# Patient Record
Sex: Female | Born: 2000
Health system: Southern US, Community
[De-identification: ages and names within clinical notes are randomized; demographics above are authoritative.]

## PROBLEM LIST (undated history)

## (undated) DIAGNOSIS — A64 Unspecified sexually transmitted disease: Secondary | ICD-10-CM

## (undated) DIAGNOSIS — F988 Other specified behavioral and emotional disorders with onset usually occurring in childhood and adolescence: Secondary | ICD-10-CM

## (undated) HISTORY — DX: Unspecified sexually transmitted disease: A64

## (undated) HISTORY — DX: Other specified behavioral and emotional disorders with onset usually occurring in childhood and adolescence: F98.8

---
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
## 2018-01-01 ENCOUNTER — Ambulatory Visit (INDEPENDENT_AMBULATORY_CARE_PROVIDER_SITE_OTHER): Payer: 59

## 2018-01-01 DIAGNOSIS — N926 Irregular menstruation, unspecified: Secondary | ICD-10-CM | POA: Diagnosis not present

## 2018-01-01 DIAGNOSIS — Z308 Encounter for other contraceptive management: Secondary | ICD-10-CM | POA: Diagnosis not present

## 2018-01-01 LAB — POCT URINE PREGNANCY: PREG TEST UR: NEGATIVE

## 2018-01-01 MED ORDER — MEDROXYPROGESTERONE ACETATE 150 MG/ML IM SUSP
150.0000 mg | Freq: Once | INTRAMUSCULAR | Status: AC
  Administered 2018-01-01: 150 mg via INTRAMUSCULAR

## 2018-01-01 NOTE — Progress Notes (Signed)
Per orders of Dr. Fabian SharpPanosh, injection of Medroxyprogesterone 150 mg/ml given by Carola RhineNancy N Nainika Newlun. Patient tolerated injection well. Pt had a pregnancy urine test due to being late for her contraceptive injection.
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
## 2018-01-05 ENCOUNTER — Ambulatory Visit: Payer: 59 | Admitting: Internal Medicine

## 2018-01-05 NOTE — Progress Notes (Signed)
Chief Complaint  Patient presents with  . Urinary Tract Infection    urine collected, strong odor, irregular periods,     HPI: Janice Sutton 17 y.o. sda    Onset less than a week ago  And stopped.  And had awekk,  When at depo   Bad odor    ' 1 partner for  2 years  No vag dc  Worried about other problems  No fever flank pain    .  ROS: See pertinent positives and negatives per HPI.  Past Medical History:  Diagnosis Date  . ADD (attention deficit disorder)   . STD (sexually transmitted disease) 07/2016   Chlamydia     Family History  Problem Relation Age of Onset  . Asthma Mother     Social History   Socioeconomic History  . Marital status: Single    Spouse name: Not on file  . Number of children: Not on file  . Years of education: Not on file  . Highest education level: Not on file  Occupational History  . Not on file  Social Needs  . Financial resource strain: Not on file  . Food insecurity:    Worry: Not on file    Inability: Not on file  . Transportation needs:    Medical: Not on file    Non-medical: Not on file  Tobacco Use  . Smoking status: Light Tobacco Smoker  . Smokeless tobacco: Never Used  Substance and Sexual Activity  . Alcohol use: No  . Drug use: No  . Sexual activity: Yes    Partners: Male    Birth control/protection: Injection  Lifestyle  . Physical activity:    Days per week: Not on file    Minutes per session: Not on file  . Stress: Not on file  Relationships  . Social connections:    Talks on phone: Not on file    Gets together: Not on file    Attends religious service: Not on file    Active member of club or organization: Not on file    Attends meetings of clubs or organizations: Not on file    Relationship status: Not on file  Other Topics Concern  . Not on file  Social History Narrative   2 people living in the home. Mother and teen    pet yorkipoo   Moved from [place]   Father high school incarcerated   mother  Araiya Tilmon ;history of asthma; MBA accounting and finance   Negative ETS /FA    Outpatient Medications Prior to Visit  Medication Sig Dispense Refill  . betamethasone valerate ointment (VALISONE) 0.1 % Apply a pea sized amount topically BID x 1-2 weeks as needed 15 g 0  . cyclobenzaprine (FLEXERIL) 10 MG tablet Take 1 tablet (10 mg total) by mouth 2 (two) times daily as needed for muscle spasms. 10 tablet 0   No facility-administered medications prior to visit.      EXAM:  BP 124/76 (BP Location: Left Arm, Patient Position: Sitting, Cuff Size: Normal)   Pulse 76   Temp 98.5 F (36.9 C) (Oral)   Wt 124 lb 12.8 oz (56.6 kg)   There is no height or weight on file to calculate BMI.  GENERAL: vitals reviewed and listed above, alert, oriented, appears well hydrated and in no acute distress HEENT: atraumatic, conjunctiva  clear, no obvious abnormalities on inspection of external nose and ears  NECK: no obvious masses on inspection palpation  Abdomen:  Sof,t normal bowel sounds without hepatosplenomegaly, no guarding rebound or masses no CVA tenderness CV: HRRR, no clubbing cyanosis or  peripheral edema nl cap refill  MS: moves all extremities without noticeable focal  abnormality PSYCH: pleasant and cooperative, no obvious depression or anxiety poct ua   sent for cx and gc chlamydia screen  ASSESSMENT AND PLAN:  Discussed the following assessment and plan:  Dysuria - Plan: POC Urinalysis Dipstick, Urine Culture  Vaginal odor - Plan: Urine Culture, Urine cytology ancillary only  Depo-Provera contraceptive status  -Patient advised to return or notify health care team  if symptoms worsen ,persist or new concerns arise.  Patient Instructions  Urine looks clear today   Will let  You know when culture tests and  gc chlamydia screen is back   No treat ment if all negative and you are doing ok .     Neta MendsWanda K. Zuly Belkin M.D.

## 2018-01-06 ENCOUNTER — Ambulatory Visit (INDEPENDENT_AMBULATORY_CARE_PROVIDER_SITE_OTHER): Payer: 59 | Admitting: Internal Medicine

## 2018-01-06 ENCOUNTER — Encounter: Payer: Self-pay | Admitting: Internal Medicine

## 2018-01-06 VITALS — BP 124/76 | HR 76 | Temp 98.5°F | Wt 124.8 lb

## 2018-01-06 DIAGNOSIS — N898 Other specified noninflammatory disorders of vagina: Secondary | ICD-10-CM | POA: Diagnosis not present

## 2018-01-06 DIAGNOSIS — R300 Dysuria: Secondary | ICD-10-CM

## 2018-01-06 DIAGNOSIS — Z3042 Encounter for surveillance of injectable contraceptive: Secondary | ICD-10-CM | POA: Diagnosis not present

## 2018-01-06 LAB — POCT URINALYSIS DIPSTICK
Bilirubin, UA: NEGATIVE
Glucose, UA: NEGATIVE
Ketones, UA: NEGATIVE
LEUKOCYTES UA: NEGATIVE
NITRITE UA: NEGATIVE
Odor: NEGATIVE
PH UA: 8 (ref 5.0–8.0)
PROTEIN UA: NEGATIVE
RBC UA: NEGATIVE
SPEC GRAV UA: 1.02 (ref 1.010–1.025)
Urobilinogen, UA: 0.2 E.U./dL

## 2018-01-06 NOTE — Patient Instructions (Signed)
Urine looks clear today   Will let  You know when culture tests and  gc chlamydia screen is back   No treat ment if all negative and you are doing ok .

## 2018-04-29 ENCOUNTER — Other Ambulatory Visit: Payer: Self-pay | Admitting: Family Medicine

## 2018-04-29 ENCOUNTER — Telehealth: Payer: Self-pay | Admitting: Family Medicine

## 2018-04-29 ENCOUNTER — Ambulatory Visit (INDEPENDENT_AMBULATORY_CARE_PROVIDER_SITE_OTHER): Payer: 59 | Admitting: Family Medicine

## 2018-04-29 DIAGNOSIS — Z309 Encounter for contraceptive management, unspecified: Secondary | ICD-10-CM | POA: Diagnosis not present

## 2018-04-29 LAB — POCT URINE PREGNANCY: Preg Test, Ur: NEGATIVE

## 2018-04-29 MED ORDER — MEDROXYPROGESTERONE ACETATE 150 MG/ML IM SUSP
150.0000 mg | Freq: Once | INTRAMUSCULAR | Status: AC
  Administered 2018-04-29: 150 mg via INTRAMUSCULAR

## 2018-04-29 NOTE — Telephone Encounter (Signed)
I spoke with pt, she did complete UA preg-result negative, she declined the future orders that were in system urine culture/cytology, reported no symptoms. The last depo provera was given on 01/01/2018, last period cycle was several years ago-not sure of exact year. I gave depo provera injection today and the dates next one will be due.

## 2018-04-29 NOTE — Progress Notes (Signed)
Per orders of Dr. Fabian SharpPanosh, injection of Depo Provera 150 mg given by Aniceto BossNIMMONS, Avory Mimbs ANN, and completed a Urine pregnancy test results negative, pt was past due for the injection. Patient tolerated injection well.

## 2018-04-29 NOTE — Telephone Encounter (Signed)
Record date of last depo  And period and then  Urine preg tets  Before given depo

## 2018-04-29 NOTE — Telephone Encounter (Signed)
I spoke with pt and she will be here today, I put on the nurse schedule.

## 2018-04-29 NOTE — Telephone Encounter (Signed)
Copied from CRM [phone]. Topic: Appointment Scheduling - Scheduling Inquiry for Clinic >> Apr 28, 2018 11:49 AM Oneal GroutSebastian, Jennifer S wrote: Reason for CRM: Missed depo injection in June, requesting to schedule, ok to schedule? >> Apr 29, 2018 11:39 AM Odies Desa, Amie CritchleySylvia A, RN wrote: I spoke with pt, explained since there was a missed dose we would need to get a order from Dr. Fabian SharpPanosh for a urine pregnancy test first and then once results are back-if negative then we can give injection. I will forward this note to provider for review and then we will call pt back to schedule appointment.

## 2018-04-29 NOTE — Telephone Encounter (Signed)
Pt

## 2018-11-10 NOTE — Progress Notes (Signed)
Chief Complaint  Patient presents with  . Vaginal Discharge    with foul odor has been going on for 1 week. No symptoms of uti. No unprotected sex.    HPI: Janice Sutton 18 y.o. come in for sda   1 week of vaginal odor   No recent  sa  Uses condomes  Off  Since October  ahs ? About fertility future   No fever uti sx   abd pain  lmp a while since on dep off since october  ROS: See pertinent positives and negatives per HPI.  Past Medical History:  Diagnosis Date  . ADD (attention deficit disorder)   . STD (sexually transmitted disease) 07/2016   Chlamydia     Family History  Problem Relation Age of Onset  . Asthma Mother     Social History   Socioeconomic History  . Marital status: Single    Spouse name: Not on file  . Number of children: Not on file  . Years of education: Not on file  . Highest education level: Not on file  Occupational History  . Not on file  Social Needs  . Financial resource strain: Not on file  . Food insecurity:    Worry: Not on file    Inability: Not on file  . Transportation needs:    Medical: Not on file    Non-medical: Not on file  Tobacco Use  . Smoking status: Light Tobacco Smoker  . Smokeless tobacco: Never Used  Substance and Sexual Activity  . Alcohol use: No  . Drug use: No  . Sexual activity: Yes    Partners: Male    Birth control/protection: Injection  Lifestyle  . Physical activity:    Days per week: Not on file    Minutes per session: Not on file  . Stress: Not on file  Relationships  . Social connections:    Talks on phone: Not on file    Gets together: Not on file    Attends religious service: Not on file    Active member of club or organization: Not on file    Attends meetings of clubs or organizations: Not on file    Relationship status: Not on file  Other Topics Concern  . Not on file  Social History Narrative   2 people living in the home. Mother and teen    pet yorkipoo   [place]oved from [place]  [place]   Father high school incarcerated    mother  Brayton LaymanLatosha Winokur ;history of asthma; MBA accounting and finance   Negative ETS /FA    Outpatient Medications Prior to Visit  Medication Sig Dispense Refill  . betamethasone valerate ointment (VALISONE) 0.1 % Apply a pea sized amount topically BID x 1-2 weeks as needed 15 g 0  . cyclobenzaprine (FLEXERIL) 10 MG tablet Take 1 tablet (10 mg total) by mouth 2 (two) times daily as needed for muscle spasms. 10 tablet 0   No facility-administered medications prior to visit.      EXAM:  BP 110/66 (BP Location: Right Arm, Patient Position: Sitting, Cuff Size: Normal)   Pulse 75   Temp 98.1 F (36.7 C) (Oral)   Wt 129 lb 11.2 oz (58.8 kg)   There is no height or weight on file to calculate BMI.  GENERAL: vitals reviewed and listed above, alert, oriented, appears well hydrated and in no acute distress HEENT: atraumatic, conjunctiva  clear, no obvious abnormalities on inspection of external nose and ears  MS: moves all extremities without noticeable focal  Abnormality  ext gu nl  Mild white dc cx 1+ ectopy whit grey  Creamy dc. Fishy odor  PSYCH: pleasant and cooperative, no obvious depression or anxiety  BP Readings from Last 3 Encounters:  11/11/18 110/66  01/06/18 124/76  09/25/17 94/62    ASSESSMENT AND PLAN:  Discussed the following assessment and plan:  Vaginal odor - Plan: Cervicovaginal ancillary only, POCT urine pregnancy  Amenorrhea - Plan: POCT urine pregnancy  Routine screening for STI (sexually transmitted infection) Poss bv  sti screen today  Get poct preg test and if ok back on dep  Counseled.    consider empiric rx  For bv and or wait until results back unable to get ua today  -Patient advised to return or notify health care team  if  new concerns arise.  Patient Instructions  Checking for STI and  Vaginal infection.   Will notify you  of labs when available.   Ok to restart the  Depoprovera.    Depo.  Should not effect  Future fertility but short term fertility is decreased  In the short run  6-9 mos   .    Neta Mends. Norah Fick M.D.

## 2018-11-11 ENCOUNTER — Encounter: Payer: Self-pay | Admitting: Internal Medicine

## 2018-11-11 ENCOUNTER — Other Ambulatory Visit (HOSPITAL_COMMUNITY)
Admission: RE | Admit: 2018-11-11 | Discharge: 2018-11-11 | Disposition: A | Discharge status: Home / Self Care | Payer: 59 | Source: Ambulatory Visit | Attending: Internal Medicine | Admitting: Internal Medicine

## 2018-11-11 ENCOUNTER — Ambulatory Visit (INDEPENDENT_AMBULATORY_CARE_PROVIDER_SITE_OTHER): Payer: 59 | Admitting: Internal Medicine

## 2018-11-11 VITALS — BP 110/66 | HR 75 | Temp 98.1°F | Wt 129.7 lb

## 2018-11-11 DIAGNOSIS — N898 Other specified noninflammatory disorders of vagina: Secondary | ICD-10-CM

## 2018-11-11 DIAGNOSIS — N912 Amenorrhea, unspecified: Secondary | ICD-10-CM | POA: Diagnosis not present

## 2018-11-11 DIAGNOSIS — Z113 Encounter for screening for infections with a predominantly sexual mode of transmission: Secondary | ICD-10-CM

## 2018-11-11 NOTE — Patient Instructions (Addendum)
Checking for STI and  Vaginal infection.   Will notify you  of labs when available.   Ok to restart the  Depoprovera.    Depo. Should not effect  Future fertility but short term fertility is decreased  In the short run  6-9 mos   .

## 2018-11-12 LAB — CERVICOVAGINAL ANCILLARY ONLY
BACTERIAL VAGINITIS: POSITIVE — AB
CANDIDA VAGINITIS: NEGATIVE
Chlamydia: POSITIVE — AB
NEISSERIA GONORRHEA: NEGATIVE
TRICH (WINDOWPATH): NEGATIVE

## 2018-11-13 ENCOUNTER — Other Ambulatory Visit: Payer: Self-pay

## 2018-11-13 MED ORDER — AZITHROMYCIN 500 MG PO TABS: ORAL_TABLET | ORAL | 0 refills | Status: DC

## 2018-11-17 ENCOUNTER — Ambulatory Visit: Payer: 59

## 2018-11-19 ENCOUNTER — Ambulatory Visit (INDEPENDENT_AMBULATORY_CARE_PROVIDER_SITE_OTHER): Payer: 59 | Admitting: *Deleted

## 2018-11-19 DIAGNOSIS — Z308 Encounter for other contraceptive management: Secondary | ICD-10-CM | POA: Diagnosis not present

## 2018-11-19 LAB — POCT URINE PREGNANCY: PREG TEST UR: NEGATIVE

## 2018-11-19 MED ORDER — MEDROXYPROGESTERONE ACETATE 150 MG/ML IM SUSP
150.0000 mg | Freq: Once | INTRAMUSCULAR | Status: AC
  Administered 2018-11-19: 150 mg via INTRAMUSCULAR

## 2018-11-19 NOTE — Addendum Note (Signed)
Addended by: Maisie Fus on: 11/19/2018 04:25 PM   Modules accepted: Orders

## 2018-11-19 NOTE — Progress Notes (Addendum)
Per orders of Dr. Fabian Sharp, POC UPT completed, which was NEGATIVE. Injection of Depo Provera 150mg  given by Nadara Eaton M. Patient tolerated injection well.

## 2018-12-01 ENCOUNTER — Telehealth: Payer: Self-pay | Admitting: *Deleted

## 2018-12-01 NOTE — Telephone Encounter (Signed)
Copied from CRM [id number]. Topic: General - Inquiry >> Dec 01, 2018  9:59 AM Terisa Starr wrote: Reason for CRM: Patient states that Dr Fabian Sharp was going to test her urine again after she got the last results on 2/7. She would like to know if that had been done? Also, she would like to know how long it takes to be cured of the chlamydia?

## 2018-12-04 NOTE — Telephone Encounter (Signed)
Not sure what is being asked . Treat for chlamyudia   And partners treated and refrain from inttercourse for at least 7 days.   Then use condoms protection 100 %  .   Recheck is usually advise  In 4-6 months  Unless having on going symptoms .   She should make appt  With [place] .   For 4-6 months     Let me know if that answers  Her ?s

## 2018-12-07 NOTE — Telephone Encounter (Signed)
lvm for pt to call back.  

## 2018-12-07 NOTE — Telephone Encounter (Signed)
Okay for nurse triage to disclose.crm created

## 2019-01-18 ENCOUNTER — Encounter: Payer: Self-pay | Admitting: Internal Medicine

## 2019-01-18 ENCOUNTER — Other Ambulatory Visit: Payer: Self-pay

## 2019-01-18 ENCOUNTER — Ambulatory Visit (INDEPENDENT_AMBULATORY_CARE_PROVIDER_SITE_OTHER): Payer: 59 | Admitting: Internal Medicine

## 2019-01-18 ENCOUNTER — Other Ambulatory Visit (HOSPITAL_COMMUNITY)
Admission: RE | Admit: 2019-01-18 | Discharge: 2019-01-18 | Disposition: A | Discharge status: Home / Self Care | Payer: 59 | Source: Ambulatory Visit | Attending: Internal Medicine | Admitting: Internal Medicine

## 2019-01-18 DIAGNOSIS — Z8619 Personal history of other infectious and parasitic diseases: Secondary | ICD-10-CM | POA: Insufficient documentation

## 2019-01-18 DIAGNOSIS — Z3042 Encounter for surveillance of injectable contraceptive: Secondary | ICD-10-CM | POA: Diagnosis not present

## 2019-01-18 DIAGNOSIS — N926 Irregular menstruation, unspecified: Secondary | ICD-10-CM | POA: Diagnosis present

## 2019-01-18 LAB — POCT URINE PREGNANCY: Preg Test, Ur: NEGATIVE

## 2019-01-18 NOTE — Addendum Note (Signed)
Addended by: Conrad Somerset on: 01/18/2019 03:39 PM   Modules accepted: Orders

## 2019-01-18 NOTE — Progress Notes (Signed)
Virtual Visit via Video Note  I connected with@ on 01/18/19 at  2:15 PM EDT by a video enabled telemedicine application and verified that I am speaking with the correct person using two identifiers. Location patient: home Location provider:work  office Persons participating in the virtual visit: patient, provider  [place] national recommendations  regarding COVID 19 pandemic   video visit is advised over in office visit for this patient.  Discussed the limitations of evaluation and management by telemedicine and  availability of in person appointments. The patient expressed understanding and agreed to proceed. Had to call with video as sound in and out.    HPI: Janice Sutton For video  visit SDA  For above  Janice Sutton has been on Dabo her last shot in February do in May. She has not had a period in a number of years being on Depo-Provera However 2 nights ago at 2 AM she noticed some mild abdominal pain or cramping and then had clots of blood gushing out vaginally and then turned into spotting so by 930 the next morning it was gone.  Since then she has not had further bleeding. No new partners she did have a positive chlamydia earlier in the year her partner was tested negative but both people were treated with the azithromycin.  See previous notes. No vaginal symptoms UTI symptoms before this.    ROS: See pertinent positives and negatives per HPI.  No fever or UTI symptoms abdominal pain currently.  Past Medical History:  Diagnosis Date  . ADD (attention deficit disorder)   . STD (sexually transmitted disease) 07/2016   Chlamydia     History reviewed. No pertinent surgical history.  Family History  Problem Relation Age of Onset  . Asthma Mother     SOCIAL HX:    Current Outpatient Medications:  .  azithromycin (ZITHROMAX) 500 MG tablet, Take 2 by mouth 1 time, Disp: 2 tablet, Rfl: 0 .  betamethasone valerate ointment (VALISONE) 0.1 %, Apply a pea sized amount topically  BID x 1-2 weeks as needed, Disp: 15 g, Rfl: 0 .  cyclobenzaprine (FLEXERIL) 10 MG tablet, Take 1 tablet (10 mg total) by mouth 2 (two) times daily as needed for muscle spasms., Disp: 10 tablet, Rfl: 0  EXAM:  VITALS per patient if applicable:  GENERAL: alert, oriented, appears well and in no acute distress  HEENT: atraumatic, conjunttiva clear, no obvious abnormalities on inspection of external nose and ears  NECK: normal movements of the head and neck  LUNGS: on inspection no signs of respiratory distress, breathing rate appears normal, no obvious gross SOB, gasping or wheezing  CV: no obvious cyanosis  PSYCH/NEURO: pleasant and cooperative, no obvious depression or anxiety, speech and thought processing grossly intact  ASSESSMENT AND PLAN:  Discussed the following assessment and plan:  Irregular bleeding - Plan: POCT urine pregnancy, Urine cytology ancillary only  History of chlamydia - Plan: POCT urine pregnancy, Urine cytology ancillary only  Depo-Provera contraceptive status  History negative for covid risk 19 ?s     Probably benign bleeding based on history however with history of chlamydia 2 months ago would repeat testing in addition to POCT pregnancy test.  She has been on time for depo   Injections so that pregnancy would be unusual.  Labs ordered for today she can come to the lab we will let her know results and follow.  Expectant management and discussion of plan and treatment with patient with opportunity to ask questions and all  were answered. The patient agreed with the plan and demonstrated an understanding of the instructions.   The patient was advised to call back or seek an in-person evaluation if worsening having concerns    or if the condition fails to improve as anticipated. Janice AndreasWanda Brodyn Depuy, MD

## 2019-01-20 ENCOUNTER — Telehealth: Payer: Self-pay | Admitting: *Deleted

## 2019-01-20 LAB — URINE CYTOLOGY ANCILLARY ONLY
Chlamydia: NEGATIVE
Neisseria Gonorrhea: NEGATIVE

## 2019-01-20 NOTE — Telephone Encounter (Signed)
Copied from CRM [id number]. Topic: General - Other >> Jan 19, 2019  5:12 PM Debroah Loop wrote: Reason for CRM: Wants lab results

## 2019-01-20 NOTE — Telephone Encounter (Signed)
Pt has been informed and that other lab is not back yet

## 2019-01-20 NOTE — Telephone Encounter (Signed)
Pregnancy test is negative and   the chlamydia gc test is not resulted yet.on my desktop ( have been waiting)   Please check with lab to see if results are back so wecan tell her results .Marland Kitchen And then tell her the status of the results

## 2019-01-25 ENCOUNTER — Encounter: Payer: Self-pay | Admitting: Internal Medicine

## 2019-01-25 ENCOUNTER — Other Ambulatory Visit: Payer: Self-pay

## 2019-01-25 ENCOUNTER — Ambulatory Visit (INDEPENDENT_AMBULATORY_CARE_PROVIDER_SITE_OTHER): Payer: 59 | Admitting: Internal Medicine

## 2019-01-25 DIAGNOSIS — J039 Acute tonsillitis, unspecified: Secondary | ICD-10-CM | POA: Diagnosis not present

## 2019-01-25 MED ORDER — AMOXICILLIN-POT CLAVULANATE 875-125 MG PO TABS: 1.0000 | ORAL_TABLET | Freq: Two times a day (BID) | ORAL | 0 refills | Status: AC

## 2019-01-25 NOTE — Progress Notes (Signed)
Virtual Visit via Video Note  I connected with@ on 01/25/19 at  2:45 PM EDT by a video enabled telemedicine application and verified that I am speaking with the correct person using two identifiers. Location patient: home Location provider:work office Persons participating in the virtual visit: patient, provider  [place] national recommendations  regarding COVID 19 pandemic   video visit is advised over in office visit for this patient.  Discussed the limitations of evaluation and management by telemedicine and  availability of in person appointments. The patient expressed understanding and agreed to proceed.   HPI: Janice Sutton  Presents today with a 2-day history of sore throat tender glands radiation of pain to her right ear without associated fever cough runny nose.  She states she has white stuff on her tonsils has a history of strep throat but she gets once to twice a year and this seems similar. Cannot remember when she had a strep test last. No exposures noted. She is using symptomatic treatment that she has done in the past but this 1 is more severe and it is hard to swallow on the right.    ROS: See pertinent positives and negatives per HPI.  No fever chills or cough.  Past Medical History:  Diagnosis Date  . ADD (attention deficit disorder)   . STD (sexually transmitted disease) 07/2016   Chlamydia     History reviewed. No pertinent surgical history.  Family History  Problem Relation Age of Onset  . Asthma Mother     Social History   Tobacco Use  . Smoking status: Light Tobacco Smoker  . Smokeless tobacco: Never Used  Substance Use Topics  . Alcohol use: No  . Drug use: No      Current Outpatient Medications:  .  amoxicillin-clavulanate (AUGMENTIN) 875-125 MG tablet, Take 1 tablet by mouth every 12 (twelve) hours for 10 days. For tonsillitis, Disp: 20 tablet, Rfl: 0 .  azithromycin (ZITHROMAX) 500 MG tablet, Take 2 by mouth 1 time, Disp: 2 tablet, Rfl:  0 .  betamethasone valerate ointment (VALISONE) 0.1 %, Apply a pea sized amount topically BID x 1-2 weeks as needed, Disp: 15 g, Rfl: 0 .  cyclobenzaprine (FLEXERIL) 10 MG tablet, Take 1 tablet (10 mg total) by mouth 2 (two) times daily as needed for muscle spasms., Disp: 10 tablet, Rfl: 0  EXAM: BP Readings from Last 3 Encounters:  11/11/18 110/66  01/06/18 124/76  09/25/17 94/62    VITALS per patient if applicable:  GENERAL: alert, oriented, appears well and in no acute distress  HEENT: atraumatic, conjunttiva clear, no obvious abnormalities on inspection of external nose and ears examination of OP through a cell phone flashlight shows 2-3+ exudate on the right tonsil minimal on the left tonsil +2  NECK: normal movements of the head and neck tenderness possibly adenopathy in the right AC node. Her speech is normal and not muffled.  There is no cough or nasal congestion.  LUNGS: on inspection no signs of respiratory distress, breathing rate appears normal, no obvious gross SOB, gasping or wheezing  CV: no obvious cyanosis  MS: moves all visible extremities without noticeable abnormality  PSYCH/NEURO: pleasant and cooperative, no obvious depression or anxiety, speech and thought processing grossly intact Lab Results  Component Value Date   WBC 4.9 (L) 07/21/2014   HGB 14.4 07/21/2014   HCT 44.2 (H) 07/21/2014   PLT 305.0 07/21/2014   GLUCOSE 80 07/21/2014   CHOL 152 07/21/2014   TRIG 49.0 07/21/2014  HDL 57.30 07/21/2014   LDLCALC 85 07/21/2014   NA 137 07/21/2014   K 3.9 07/21/2014   CL 102 07/21/2014   CREATININE 0.9 07/21/2014   BUN 14 07/21/2014   CO2 25 07/21/2014   TSH 1.04 07/21/2014    ASSESSMENT AND PLAN:  Discussed the following assessment and plan:  Exudative tonsillitis - asymmetric right more than left see text  Unilateral tonsillitis with adenopathy and history of "strep throat" recurrent sore throats over the years. Even without the use of the  throat swab her exam showing unilateral asymmetry would add antibiotic for probable bacterial tonsillitis. Augmentin for 10 days gargles symptomatic treatment if not improved in 2 to 3 days contact us for follow-up advice If getting recurrent tonsillitis relapsing recheck video visit or in person Consideration if frequent to see ear nose and throat for consult.  Counseled.   Expectant management and discussion of plan and treatment with patient with opportunity to ask questions and all were answered. The patient agreed with the plan and demonstrated an understanding of the instructions.   The patient was advised to call back or seek an in-person evaluation if worsening  or having concerns .    Berniece AndreasWanda Delrick Dehart, MD

## 2019-02-03 ENCOUNTER — Ambulatory Visit (INDEPENDENT_AMBULATORY_CARE_PROVIDER_SITE_OTHER): Payer: 59 | Admitting: Internal Medicine

## 2019-02-03 ENCOUNTER — Telehealth: Payer: Self-pay | Admitting: Obstetrics and Gynecology

## 2019-02-03 ENCOUNTER — Encounter: Payer: Self-pay | Admitting: Internal Medicine

## 2019-02-03 ENCOUNTER — Other Ambulatory Visit: Payer: Self-pay

## 2019-02-03 DIAGNOSIS — Z3042 Encounter for surveillance of injectable contraceptive: Secondary | ICD-10-CM | POA: Diagnosis not present

## 2019-02-03 DIAGNOSIS — Z8619 Personal history of other infectious and parasitic diseases: Secondary | ICD-10-CM | POA: Diagnosis not present

## 2019-02-03 DIAGNOSIS — N920 Excessive and frequent menstruation with regular cycle: Secondary | ICD-10-CM

## 2019-02-03 NOTE — Telephone Encounter (Signed)
Spoke with patient. Patient states that on 01/16/2019 at 2 AM she noticed some mild abdominal pain/cramping and then passed many blood clots. Bleeding increased "was gushing" out vaginally and then turned into spotting and by 9:30 the next morning it was gone.  No bleeding since. On 02/03/2019 at 11 am she had an intermittent sharp pain and a "gush of bleeding" that was dark red in color. Patient is not experiencing any pain at this time and is having spotting only when she wipes. Usually does not have cycles with Depo Provera. Next injection is due May 13 with patient's PCP office. Patient's took upt 4/13 which was negative, but has had intercourse since. Advised to take another UPT. Advised will need office visit for further evaluation. Patient is agreeable. Appointment scheduled for 02/05/2019 at 2 pm with Dr.Jertson. Advised if bleeding increases, develops any pain, or any new symptoms will need to be seen earlier in the office for evaluation. Patient is agreeable.  Routing to provider and will close encounter.

## 2019-02-03 NOTE — Telephone Encounter (Signed)
Patient is asking to talk with Dr.Jertson about irregular bleeding on depo provera.

## 2019-02-03 NOTE — Progress Notes (Signed)
Virtual Visit via Video Note  I connected with@ on 02/03/19 at 12:30 PM EDT by a video enabled telemedicine application and verified that I am speaking with the correct person using two identifiers. Location patient: home Location provider:home office Persons participating in the virtual visit: patient, provider  [place] national recommendations  regarding COVID 19 pandemic   video visit is advised over in office visit for this patient.  Patient aware  the limitations of evaluation and management by telemedicine and  availability of in person appointments. The patient expressed understanding and agreed to proceed.   HPI: Janice Sutton  presents for VV for new issue  She began having vag bleeding again  Last night   Seen  A few weeks ago for the bleeding  April 12   And had    Due for  Depo may 13.   No new exposures since we did ucg and chlamydia screen   Bleeding in sleep   Last night .  Little  Discomfort .  When she was on depo  No hx of this bleeding  .  No other  bleeding bruising and no fever uti sx   ROS: See pertinent positives and negatives per HPI.  Her throat  infection is  A lot better and is still on antibiotic  To finish out   Past Medical History:  Diagnosis Date  . ADD (attention deficit disorder)   . STD (sexually transmitted disease) 07/2016   Chlamydia     History reviewed. No pertinent surgical history.  Family History  Problem Relation Age of Onset  . Asthma Mother     Social History   Tobacco Use  . Smoking status: Light Tobacco Smoker  . Smokeless tobacco: Never Used  Substance Use Topics  . Alcohol use: No  . Drug use: No      Current Outpatient Medications:  .  amoxicillin-clavulanate (AUGMENTIN) 875-125 MG tablet, Take 1 tablet by mouth every 12 (twelve) hours for 10 days. For tonsillitis, Disp: 20 tablet, Rfl: 0 .  azithromycin (ZITHROMAX) 500 MG tablet, Take 2 by mouth 1 time, Disp: 2 tablet, Rfl: 0 .  betamethasone valerate ointment  (VALISONE) 0.1 %, Apply a pea sized amount topically BID x 1-2 weeks as needed, Disp: 15 g, Rfl: 0 .  cyclobenzaprine (FLEXERIL) 10 MG tablet, Take 1 tablet (10 mg total) by mouth 2 (two) times daily as needed for muscle spasms., Disp: 10 tablet, Rfl: 0  EXAM: BP Readings from Last 3 Encounters:  11/11/18 110/66  01/06/18 124/76  09/25/17 94/62    VITALS per patient if applicable:  GENERAL: alert, oriented, appears well and in no acute distress looks well normal  Speech   HEENT: atraumatic, conjunttiva clear, no obvious abnormalities on inspection of external nose and ears  NECK: normal movements of the head and neck  LUNGS: on inspection no signs of respiratory distress, breathing rate appears normal, no obvious gross SOB, gasping or wheezing  CV: no obvious cyanosis   PSYCH/NEURO: pleasant and cooperative, no obvious depression or anxiety, speech and thought processing grossly intact Lab Results  Component Value Date   WBC 4.9 (L) 07/21/2014   HGB 14.4 07/21/2014   HCT 44.2 (H) 07/21/2014   PLT 305.0 07/21/2014   GLUCOSE 80 07/21/2014   CHOL 152 07/21/2014   TRIG 49.0 07/21/2014   HDL 57.30 07/21/2014   LDLCALC 85 07/21/2014   NA 137 07/21/2014   K 3.9 07/21/2014   CL 102 07/21/2014   CREATININE 0.9  07/21/2014   BUN 14 07/21/2014   CO2 25 07/21/2014   TSH 1.04 07/21/2014    ASSESSMENT AND PLAN:  Discussed the following assessment and plan:  Frequent menstruation  Depo-Provera contraceptive status - last inj 2 13 2020  History of chlamydia Recurrent   Frequent bleeding on depo  On restart but no hx of same   Screen last time for chlamydia ( urine screen)  No new exposures   Not hemodynamically   significant at this time but frequent  Counseled.   Expectant management and discussion of plan and treatment with patient with opportunity to ask questions and all were answered. The patient agreed with the plan and demonstrated an understanding of the instructions.   advise we have her see  Dr Lanny Hurst   About this options discussed   They may be doing virtual visits to start  Will send her the note  The patient was advised to call back or seek an in-person evaluation if worsening  or having concerns . In interim    Berniece Andreas, MD

## 2019-02-04 NOTE — Progress Notes (Signed)
poGYNECOLOGY  VISIT   HPI: 18 y.o.   Single Black or African American Not Hispanic or Latino  female   [id number] with No LMP recorded. Patient has had an injection.   here for abnormal bleeding on depo provera.   She has been on the depo-provera for years. No bleeding for a long time until 4/12. On 4/12 she bleed for a couple of hours, not heavy. Light bleeding again 2 days ago. Next depo-provera is due 5/13. Sexually active, same partner x 4 years, uses condoms sometimes. No dyspareunia. Had sex prior to bleeding both times. Other times has had sex without bleeding. No pain.     GYNECOLOGIC HISTORY: No LMP recorded. Patient has had an injection. Contraception: depo provera Menopausal hormone therapy: none        OB History    Gravida  0   Para  0   Term  0   Preterm  0   AB  0   Living  0     SAB  0   TAB  0   Ectopic  0   Multiple  0   Live Births                 Patient Active Problem List   Diagnosis Date Noted  . Depo-Provera contraceptive status 01/18/2019  . History of chlamydia 01/18/2019  . Dysmenorrhea 07/24/2014  . Irregular menstrual cycle 07/24/2014  . History of ADHD 04/16/2013  . ADHD (attention deficit hyperactivity disorder) 04/16/2013    Past Medical History:  Diagnosis Date  . ADD (attention deficit disorder)   . STD (sexually transmitted disease) 07/2016   Chlamydia     History reviewed. No pertinent surgical history.  Current Outpatient Medications  Medication Sig Dispense Refill  . medroxyPROGESTERone (DEPO-PROVERA) 150 MG/ML injection Inject 150 mg into the muscle every 3 (three) months.     No current facility-administered medications for this visit.      ALLERGIES: Patient has no known allergies.  Family History  Problem Relation Age of Onset  . Asthma Mother     Social History   Socioeconomic History  . Marital status: Single    Spouse name: Not on file  . Number of children: Not on file  . Years of  education: Not on file  . Highest education level: Not on file  Occupational History  . Not on file  Social Needs  . Financial resource strain: Not on file  . Food insecurity:    Worry: Not on file    Inability: Not on file  . Transportation needs:    Medical: Not on file    Non-medical: Not on file  Tobacco Use  . Smoking status: Light Tobacco Smoker  . Smokeless tobacco: Never Used  Substance and Sexual Activity  . Alcohol use: No  . Drug use: No  . Sexual activity: Yes    Partners: Male    Birth control/protection: Injection  Lifestyle  . Physical activity:    Days per week: Not on file    Minutes per session: Not on file  . Stress: Not on file  Relationships  . Social connections:    Talks on phone: Not on file    Gets together: Not on file    Attends religious service: Not on file    Active member of club or organization: Not on file    Attends meetings of clubs or organizations: Not on file    Relationship status: Not on file  .  Intimate partner violence:    Fear of current or ex partner: Not on file    Emotionally abused: Not on file    Physically abused: Not on file    Forced sexual activity: Not on file  Other Topics Concern  . Not on file  Social History Narrative   2 people living in the home. Mother and teen    pet [employer]   [place]   Father high school incarcerated    mother  Brayton LaymanLatosha Goeken ;history of asthma; MBA accounting and finance   Negative ETS /FA    Review of Systems  Constitutional: Negative.   HENT: Negative.   Eyes: Negative.   Respiratory: Negative.   Cardiovascular: Negative.   Gastrointestinal: Negative.   Genitourinary:       Bleeding on depo provera  Musculoskeletal: Negative.   Skin: Negative.   Neurological: Negative.   Endo/Heme/Allergies: Negative.   Psychiatric/Behavioral: Negative.     PHYSICAL EXAMINATION:    BP 100/64   Pulse 68   Resp 16   Wt 127 lb (57.6 kg)     General appearance:  alert, cooperative and appears stated age   Pelvic: External genitalia:  no lesions              Urethra:  normal appearing urethra with no masses, tenderness or lesions              Bartholins and Skenes: normal                 Vagina: normal appearing vagina with slight erythema and slight increase in thin white vaginal d/c (patient denies symptoms)              Cervix: no cervical motion tenderness and no lesions              Bimanual Exam:  Uterus:  normal size, contour, position, consistency, mobility, non-tender              Adnexa: no mass, fullness, tenderness                Chaperone was present for exam.  ASSESSMENT BTB on depo-provera x 2, light amount. Not currently bleeding Screening STD    PLAN upt-neg Screening for STD (declines blood work) Condoms encouraged Call with continued bleeding. If bleeding persists will treat with OCP's for 2 months (in addition to the depo-provera) F/U for an annual exam in 2 months   An After Visit Summary was printed and given to the patient.

## 2019-02-05 ENCOUNTER — Encounter: Payer: Self-pay | Admitting: Obstetrics and Gynecology

## 2019-02-05 ENCOUNTER — Other Ambulatory Visit: Payer: Self-pay

## 2019-02-05 ENCOUNTER — Ambulatory Visit (INDEPENDENT_AMBULATORY_CARE_PROVIDER_SITE_OTHER): Payer: 59 | Admitting: Obstetrics and Gynecology

## 2019-02-05 VITALS — BP 100/64 | HR 68 | Resp 16 | Wt 127.0 lb

## 2019-02-05 DIAGNOSIS — Z113 Encounter for screening for infections with a predominantly sexual mode of transmission: Secondary | ICD-10-CM

## 2019-02-05 DIAGNOSIS — N921 Excessive and frequent menstruation with irregular cycle: Secondary | ICD-10-CM | POA: Diagnosis not present

## 2019-02-05 LAB — POCT URINE PREGNANCY: Preg Test, Ur: NEGATIVE

## 2019-02-08 LAB — CHLAMYDIA/GONOCOCCUS/TRICHOMONAS, NAA
Chlamydia by NAA: NEGATIVE
Gonococcus by NAA: NEGATIVE
Trich vag by NAA: NEGATIVE

## 2019-02-16 ENCOUNTER — Encounter: Payer: Self-pay | Admitting: Family Medicine

## 2019-02-16 ENCOUNTER — Ambulatory Visit: Payer: Self-pay | Admitting: *Deleted

## 2019-02-16 ENCOUNTER — Other Ambulatory Visit: Payer: Self-pay

## 2019-02-16 ENCOUNTER — Ambulatory Visit (INDEPENDENT_AMBULATORY_CARE_PROVIDER_SITE_OTHER): Payer: 59 | Admitting: Family Medicine

## 2019-02-16 DIAGNOSIS — J029 Acute pharyngitis, unspecified: Secondary | ICD-10-CM | POA: Diagnosis not present

## 2019-02-16 DIAGNOSIS — Z20828 Contact with and (suspected) exposure to other viral communicable diseases: Secondary | ICD-10-CM | POA: Diagnosis not present

## 2019-02-16 NOTE — Patient Instructions (Addendum)
-  call the number provided for Coronavirus testing today or tomorrow. If any issues getting tested please call our office right away.  -our office should reach out to you about strep testing tomorrow. If someone has not contacted you by noon tomorrow call our office. If no testing is available at our office I would recommend that you go to urgent care for strep testing tomorrow.   -You should not go to work until: 1) 10 days have passed since your sore throat started AND 3 days of no fever and improving symptoms. OR 2) You are feeling better and you are seen next week by Dr. Fabian Sharp and are allowed to return early depending on the tests results.  I hope you are feeling better soon! Seek care immediately if your symptoms worsen, new concerns arise or you are not improving with treatment.

## 2019-02-16 NOTE — Progress Notes (Addendum)
Virtual Visit via Video Note  I connected with   on 02/16/19 at  4:00 PM EDT by a video enabled telemedicine application and verified that I am speaking with the correct person using two identifiers.  Location patient: home, [place] Location provider:work or home office Persons participating in the virtual visit: patient, provider  HPI:  Sore throat: -started 2-3 days ago -had bad sore throat last month with exudate and was treated with Augmentin as reports a hx of strep throat - reports all symptoms resolved promptly -no fever, chills, malaise, cough, SOB, NVD, nasal congestion, dysphagia, loss of taste or smell -mainly at night and in the morning - reports is from the fans and that fans at home and at work cause her strep throat -no bad taste -reports works at [employer] and that several co-workers have tested positive for [employer] - reports was not directly around them, but she was told needs work note to be out until sore throat is gone -she also wants another course of abx  ROS: See pertinent positives and negatives per HPI.  Past Medical History:  Diagnosis Date  . ADD (attention deficit disorder)   . STD (sexually transmitted disease) 07/2016   Chlamydia     History reviewed. No pertinent surgical history.  Family History  Problem Relation Age of Onset  . Asthma Mother     SOCIAL HX: see hpi   Current Outpatient Medications:  .  medroxyPROGESTERone (DEPO-PROVERA) 150 MG/ML injection, Inject 150 mg into the muscle every 3 (three) months., Disp: , Rfl:   EXAM:  VITALS per patient if applicable: denies fever  GENERAL: alert, oriented, appears well and in no acute distress  HEENT: atraumatic, conjunttiva clear, no obvious abnormalities on inspection of external nose and ears, moist mucus membranes, limited exam  NECK: normal movements of the head and neck  LUNGS: on inspection no signs of respiratory distress, breathing rate appears normal, no obvious gross SOB, gasping or  wheezing  CV: no obvious cyanosis  MS: moves all visible extremities without noticeable abnormality  PSYCH/NEURO: pleasant and cooperative, no obvious depression or anxiety, speech and thought processing grossly intact  ASSESSMENT AND PLAN: More than 50% of over 25 minutes spent in total in caring for this patient was spent face-to-face with the patient, counseling and/or coordinating care.    Discussed the following assessment and plan:  Sore throat  -we discussed possible serious and likely etiologies, workup and treatment, treatment risks and return precautions -recently treated with full course of Augmentin for strep and recovered. Reports always gets sore throat from fans and this mainly occurs when fans on. She also reports COVID19 + coworkers.advised strep testing, COVID testing and close follow up along with return/ER precautions. Advised out of work per [employer] guideline and/or pending testing results. -provided number to call for community testing site for COVID19 and advised testing tomorrow or call our office if any trouble getting tested. -checking with nurse manager about curb strep testing tomorrow or advise pt will need to go to [facility] tomorrow for strep testing as with minimal symptoms or exam findings would prefer to avoid abx give took them last month without obvious strep.If does have recurrent strep will need abx and ENT referral.   -of course, we advised Graceyn  to return or notify a doctor immediately if symptoms worsen or persist or new concerns arise.    I discussed the assessment and treatment plan with the patient. The patient was provided an opportunity to ask questions and  all were answered. The patient agreed with the plan and demonstrated an understanding of the instructions.   The patient was advised to call back or seek an in-person evaluation if the symptoms worsen or if the condition fails to improve as anticipated.   Follow up instructions: Advised assistant  Ronnald CollumJo Anne to help patient arrange the following: -this patient needs a curb side strep test and culture if that is something our site can do (will require full PPE, please ask Asher MuirJamie) OR needs to [facility] -she needs a follow up visit with Dr. Fabian SharpPanosh (or me) next week  -she needs a work note that she was seen and should be out of work for 8 days AND 3 days of no fever with improving symptoms OR per further instructions if another note is provided at re-evaluation with her doctor next week and pending results of tests.   Terressa KoyanagiHannah R Kian Gamarra, DO

## 2019-02-16 NOTE — Telephone Encounter (Signed)
Call was dropped or pt hung up prior to transfer. Will attempt CB if pt does not call back

## 2019-02-17 ENCOUNTER — Telehealth: Payer: Self-pay | Admitting: *Deleted

## 2019-02-17 ENCOUNTER — Encounter: Payer: Self-pay | Admitting: *Deleted

## 2019-02-17 NOTE — Telephone Encounter (Signed)
Per office notes from 5/12, I called the pt to offer a time to come to the office for a strep test and culture.  Patient states she will call back for the appt.

## 2019-02-18 NOTE — Telephone Encounter (Signed)
I left a message for the pt to return my call. 

## 2019-02-22 ENCOUNTER — Encounter: Payer: Self-pay | Admitting: Internal Medicine

## 2019-02-22 ENCOUNTER — Other Ambulatory Visit: Payer: Self-pay

## 2019-02-22 ENCOUNTER — Ambulatory Visit (INDEPENDENT_AMBULATORY_CARE_PROVIDER_SITE_OTHER): Payer: 59 | Admitting: Internal Medicine

## 2019-02-22 DIAGNOSIS — J0391 Acute recurrent tonsillitis, unspecified: Secondary | ICD-10-CM

## 2019-02-22 DIAGNOSIS — J029 Acute pharyngitis, unspecified: Secondary | ICD-10-CM | POA: Diagnosis not present

## 2019-02-22 NOTE — Progress Notes (Signed)
Virtual Visit via Video Note  I connected with@ on 02/22/19 at  1:00 PM EDT by a video enabled telemedicine application and verified that I am speaking with the correct person using two identifiers. Location patient: home Location provider:work or home office Persons participating in the virtual visit: patient, provider  [place] national recommendations  regarding COVID 19 pandemic   video visit is advised over in office visit for this patient.  Patient aware  of the limitations of evaluation and management by telemedicine and  availability of in person appointments. and agreed to proceed. Video techinichal problems so had to finish on  Phone  Visit   HPI: Janice Sutton presents for video visit Recurrent sore throat  Vv in April with addenopahty  Sore throat without fever  Responded well to the antibiotic ( 2 days ) did have some gi SE early on but finished med  Then amonth later some st and  Had vv with dr Selena BattenKim  Who advised strep and culture tests ing  Didn't get done( care not working)  But calling now cause has 4 days of st and now working at [employer] not suppose dot work with new resp sx and was told to get a letter about her conditions .   No fever today  ? If exudate or not  ( says she uses h2o2 to wash off the white  In throat.   ROS: See pertinent positives and negatives per HPI.  Past Medical History:  Diagnosis Date  . ADD (attention deficit disorder)   . STD (sexually transmitted disease) 07/2016   Chlamydia     No past surgical history on file.  Family History  Problem Relation Age of Onset  . Asthma Mother     Social History   Tobacco Use  . Smoking status: Light Tobacco Smoker  . Smokeless tobacco: Never Used  Substance Use Topics  . Alcohol use: No  . Drug use: No      Current Outpatient Medications:  .  medroxyPROGESTERone (DEPO-PROVERA) 150 MG/ML injection, Inject 150 mg into the muscle every 3 (three) months., Disp: , Rfl:   EXAM: BP Readings from  Last 3 Encounters:  02/05/19 100/64  11/11/18 110/66  01/06/18 124/76    VITALS per patient if applicable:  GENERAL: alert, oriented, appears well and in no acute distress  Unable to examine today  PSYCH/NEURO: pleasant and cooperative, no obvious depression or anxiety, speech and thought processing grossly intact Lab Results  Component Value Date   WBC 4.9 (L) 07/21/2014   HGB 14.4 07/21/2014   HCT 44.2 (H) 07/21/2014   PLT 305.0 07/21/2014   GLUCOSE 80 07/21/2014   CHOL 152 07/21/2014   TRIG 49.0 07/21/2014   HDL 57.30 07/21/2014   LDLCALC 85 07/21/2014   NA 137 07/21/2014   K 3.9 07/21/2014   CL 102 07/21/2014   CREATININE 0.9 07/21/2014   BUN 14 07/21/2014   CO2 25 07/21/2014   TSH 1.04 07/21/2014    ASSESSMENT AND PLAN:  Discussed the following assessment and plan:  Recurrent tonsillitis - Plan: POCT rapid strep A, Culture, Group A Strep, Ambulatory referral to ENT  Sore throat Partly failed video visit   Recurrent throat pain and presumed recurrent tonsillitis   . See my note April and dr Selena BattenKim from Last week.    ( Her car is now not working so has some transportation challenges   And timing)    Advise note for her work   ent referral  Strep test and strep culture   Ordered As possible  Counseled.   Expectant management and discussion of plan and treatment with opportunity to ask questions and all were answered. The patient agreed with the plan and demonstrated an understanding of the instructions.   Advised to call back or seek an in-person evaluation if worsening  or having  further concerns . 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   Berniece Andreas, MD

## 2019-02-23 DIAGNOSIS — J0391 Acute recurrent tonsillitis, unspecified: Secondary | ICD-10-CM | POA: Diagnosis not present

## 2019-02-23 LAB — POCT RAPID STREP A (OFFICE): Rapid Strep A Screen: NEGATIVE

## 2019-02-23 NOTE — Addendum Note (Signed)
Addended by: Conrad Donnelly on: 02/23/2019 02:20 PM   Modules accepted: Orders

## 2019-02-25 LAB — CULTURE, GROUP A STREP
MICRO NUMBER:: 487567
SPECIMEN QUALITY:: ADEQUATE

## 2019-03-03 NOTE — Telephone Encounter (Signed)
Note closed as of today patient did not return my call.  Appears patient was seen by Dr Fabian Sharp on 5/18.

## 2019-03-19 ENCOUNTER — Ambulatory Visit (INDEPENDENT_AMBULATORY_CARE_PROVIDER_SITE_OTHER): Payer: 59 | Admitting: Internal Medicine

## 2019-03-19 ENCOUNTER — Other Ambulatory Visit: Payer: Self-pay

## 2019-03-19 ENCOUNTER — Encounter: Payer: Self-pay | Admitting: Internal Medicine

## 2019-03-19 DIAGNOSIS — L282 Other prurigo: Secondary | ICD-10-CM

## 2019-03-19 NOTE — Progress Notes (Signed)
Virtual Visit via Video Note  I connected with@ on 03/19/19 at  1:15 PM EDT by a video enabled telemedicine application and verified that I am speaking with the correct person using two identifiers. Location patient: home Location provider:work or home office Persons participating in the virtual visit: patient, provider  [place] national recommendations  regarding COVID 19 pandemic   video visit is advised over in office visit for this patient.  Patient aware  of the limitations of evaluation and management by telemedicine and  availability of in person appointments. and agreed to proceed.   HPI: Janice Sutton presents for video visit onset ? While at work itching  But not sure if something bit her .Marland Kitchen  About 4+ dauys ago  Was itching a bit    Just began   HCS  Cream yesterday and seems some better   Works in [employer] and  Slept over  Not sure if related  To location   No fever other systemic sx   No pain les itching today  No one else with rash? ROS: See pertinent positives and negatives per HPI.  Past Medical History:  Diagnosis Date  . ADD (attention deficit disorder)   . STD (sexually transmitted disease) 07/2016   Chlamydia     History reviewed. No pertinent surgical history.  Family History  Problem Relation Age of Onset  . Asthma Mother     Social History   Tobacco Use  . Smoking status: Light Tobacco Smoker  . Smokeless tobacco: Never Used  Substance Use Topics  . Alcohol use: No  . Drug use: No      Current Outpatient Medications:  .  medroxyPROGESTERone (DEPO-PROVERA) 150 MG/ML injection, Inject 150 mg into the muscle every 3 (three) months., Disp: , Rfl:   EXAM: BP Readings from Last 3 Encounters:  02/05/19 100/64  11/11/18 110/66  01/06/18 124/76    VITALS per patient if applicable:  GENERAL: alert, oriented, appears well and in no acute distress  HEENT: atraumatic, conjunttiva clear, no obvious abnormalities on inspection of external nose and  ears NECK: normal movements of the head and neck LUNGS: on inspection no signs of respiratory distress, breathing rate appears normal, no obvious gross SOB, gasping or wheezing CV: no obvious cyanosis SKIN:  Right upper arm  Shows  4 round flat hyper pigmented   Spots in a row  About size of penny   No scale or redness and no streaking or edema    On  Ovoid patch on right lower back .  MS: moves all visible extremities without noticeable abnormality PSYCH/NEURO: pleasant and cooperative, no obvious depression or anxiety, speech and thought processing grossly intact   ASSESSMENT AND PLAN:  Discussed the following assessment and plan: 1. Pruritic rash  Poss   Bug bites? Rash  Looks like fading ? Bite  Hyper pigment is post inflammatory? Curious that is in a row  ? Bed bugs other bites  . No alarm features and  Reassurance and contact if alarm sx  . Continue HCS cream  And may take a week or 2 to totally fade  Counseled.   Expectant management and discussion of plan and treatment with opportunity to ask questions and all were answered. The patient agreed with the plan and demonstrated an understanding of the instructions.   Advised to call back or seek an in-person evaluation if worsening  or having  further concerns . In the interim .    Shanon Ace, MD

## 2019-05-19 ENCOUNTER — Telehealth (INDEPENDENT_AMBULATORY_CARE_PROVIDER_SITE_OTHER): Payer: 59 | Admitting: Internal Medicine

## 2019-05-19 ENCOUNTER — Other Ambulatory Visit: Payer: Self-pay

## 2019-05-19 ENCOUNTER — Encounter: Payer: Self-pay | Admitting: Internal Medicine

## 2019-05-19 DIAGNOSIS — J029 Acute pharyngitis, unspecified: Secondary | ICD-10-CM

## 2019-05-19 DIAGNOSIS — Z8709 Personal history of other diseases of the respiratory system: Secondary | ICD-10-CM | POA: Diagnosis not present

## 2019-05-19 DIAGNOSIS — N920 Excessive and frequent menstruation with regular cycle: Secondary | ICD-10-CM

## 2019-05-19 DIAGNOSIS — Z3042 Encounter for surveillance of injectable contraceptive: Secondary | ICD-10-CM | POA: Diagnosis not present

## 2019-05-19 NOTE — Progress Notes (Signed)
Virtual Visit via Video Note  I connected with@ on 05/19/19 at  3:45 PM EDT by a video enabled telemedicine application and verified that I am speaking with the correct person using two identifiers. Location patient: home Location provider:work  office Persons participating in the virtual visit: patient, provider  [place]th national recommendations  regarding COVID 19 pandemic   video visit is advised over in office visit for this patient.  Patient aware  of the limitations of evaluation and management by telemedicine and  availability of in person appointments. and agreed to proceed.   HPI: Janice Sutton presents for video visit  She needs  Note to  document her recurrent throat sx  She had some today and fans at work  Bother her ears.  She never saw ent  days didn't get a ppt but would like the information.   also  she had episode of   Unexpected menstrual bleeding  That went through her clothes and supervisor wouldn't not let her go home or get changed . Complicated that she [place] to [place] [employer] daily and works third shift  looking for closer  Place to live .    She had seen gyne    And supposed to fy if needed   Seen in MAY  .   Note says fu if bleeding and consider adding ocps other .    ROS: See pertinent positives and negatives per HPI.  Past Medical History:  Diagnosis Date  . ADD (attention deficit disorder)   . STD (sexually transmitted disease) 07/2016   Chlamydia     No past surgical history on file.  Family History  Problem Relation Age of Onset  . Asthma Mother     Social History   Tobacco Use  . Smoking status: Light Tobacco Smoker  . Smokeless tobacco: Never Used  Substance Use Topics  . Alcohol use: No  . Drug use: No      Current Outpatient Medications:  .  medroxyPROGESTERone (DEPO-PROVERA) 150 MG/ML injection, Inject 150 mg into the muscle every 3 (three) months., Disp: , Rfl:   EXAM: BP Readings from Last 3 Encounters:  02/05/19 100/64   11/11/18 110/66  01/06/18 124/76    VITALS per patient if applicable:  GENERAL: alert, oriented, appears well and in no acute distress HEENT: atraumatic, conjunttiva clear, no obvious abnormalities on inspection of external nose and ears NECK: normal movements of the head and neck LUNGS: on inspection no signs of respiratory distress, breathing rate appears normal, no obvious gross SOB, gasping or wheezing CV: no obvious cyanosis PSYCH/NEURO: pleasant and cooperative, no obvious depression or anxiety, speech and thought processing grossly intact   ASSESSMENT AND PLAN:  Discussed the following assessment and plan:    ICD-10-CM   1. History of recurrent tonsillitis  Z87.09   2. Frequent menstruation  N92.0    Unexpected bleeding will try to  Have a change of clothes and  She will get fu appt with gyne   And I will  Make communication to let her get off the floor to  Change or [facility] but she should have  Supplies with her  To be able to change.  I advise she see  ent still will send her information  Of the ent practice  She can call for appt .   Counseled.   Expectant management and discussion of plan and treatment with opportunity to ask questions and all were answered. The patient  agreed with the plan and demonstrated an understanding of the instructions.   Advised to call back or seek an in-person evaluation if worsening  or having  further concerns .   Shanon Ace, MD

## 2019-05-24 ENCOUNTER — Telehealth: Payer: Self-pay | Admitting: Obstetrics and Gynecology

## 2019-05-24 NOTE — Telephone Encounter (Signed)
Call to patient. Patient requesting an follow up appointment after seeing her PCP for virtual visit on 05-19-2019. Patient states that she went off depo in June. Patient states she hadn't planned to go off of the depo, "it's just something I haven't went to get. I keep forgetting." Patient had been receiving depo provera through PCP and states she didn't have a period at all while taking depo. Patient states this last period started 05-15-2019 and that she would bleed and then skip a day. States the day she started the bleeding "came out a lot and was brown." Not currently bleeding today, but states she is having some "stomach pain." Not currently taking any medication for pain. Patient scheduled for 05-26-2019 at 1530. Patient agreeable to date and time of appointment.   Routing to provider and will close encounter.

## 2019-05-24 NOTE — Telephone Encounter (Signed)
Patient was bleeding for a week and want to come in for recheck.

## 2019-05-25 NOTE — Progress Notes (Signed)
GYNECOLOGY  VISIT   HPI: 18 y.o.   Single Black or African American Not Hispanic or Latino  female   [id number] with Patient's last menstrual period was 05/22/2019 (exact date).   here for irregular bleeding with Depo. The patient has been on depo-provera for years, started having BTB in 4/20, 5/20 and then again in August, she just stopped bleeding on 8/15, light bleeding for a week. She is having slight cramping. Prior to the depo-provera her cramps were very bad. She and her partner broke up 1-2 months ago, together x 4 year. She has a new partner, using condoms.   GYNECOLOGIC HISTORY: Patient's last menstrual period was 05/22/2019 (exact date). Contraception: Depo Provera injections Menopausal hormone therapy: None  Pap: none Gardasil Unsure, will check        OB History    Gravida  0   Para  0   Term  0   Preterm  0   AB  0   Living  0     SAB  0   TAB  0   Ectopic  0   Multiple  0   Live Births                 Patient Active Problem List   Diagnosis Date Noted  . Depo-Provera contraceptive status 01/18/2019  . History of chlamydia 01/18/2019  . Dysmenorrhea 07/24/2014  . Irregular menstrual cycle 07/24/2014  . History of ADHD 04/16/2013  . ADHD (attention deficit hyperactivity disorder) 04/16/2013    Past Medical History:  Diagnosis Date  . ADD (attention deficit disorder)   . STD (sexually transmitted disease) 07/2016   Chlamydia     History reviewed. No pertinent surgical history.  Current Outpatient Medications  Medication Sig Dispense Refill  . medroxyPROGESTERone (DEPO-PROVERA) 150 MG/ML injection Inject 150 mg into the muscle every 3 (three) months.     No current facility-administered medications for this visit.      ALLERGIES: Patient has no known allergies.  Family History  Problem Relation Age of Onset  . Asthma Mother     Social History   Socioeconomic History  . Marital status: Single    Spouse name: Not on file  .  Number of children: Not on file  . Years of education: Not on file  . Highest education level: Not on file  Occupational History  . Not on file  Social Needs  . Financial resource strain: Not on file  . Food insecurity    Worry: Not on file    Inability: Not on file  . Transportation needs    Medical: Not on file    Non-medical: Not on file  Tobacco Use  . Smoking status: Light Tobacco Smoker  . Smokeless tobacco: Never Used  Substance and Sexual Activity  . Alcohol use: No  . Drug use: No  . Sexual activity: Yes    Partners: Male    Birth control/protection: Injection  Lifestyle  . Physical activity    Days per week: Not on file    Minutes per session: Not on file  . Stress: Not on file  Relationships  . Social Musicianconnections    Talks on phone: Not on file    Gets together: Not on file    Attends religious service: Not on file    Active member of club or organization: Not on file    Attends meetings of clubs or organizations: Not on file    Relationship status: Not on file  .  Intimate partner violence    Fear of current or ex partner: Not on file    Emotionally abused: Not on file    Physically abused: Not on file    Forced sexual activity: Not on file  Other Topics Concern  . Not on file  Social History Narrative   2 people living in the home. Mother and teen    pet [employer]   Moved from [place]   Father high school incarcerated    mother  Shawanda Sievert ;history of asthma; MBA accounting and finance   Negative ETS /FA    Review of Systems  Constitutional: Negative.   HENT: Negative.   Eyes: Negative.   Respiratory: Negative.   Cardiovascular: Negative.   Gastrointestinal: Negative.   Genitourinary:       Irregular bleeding  Musculoskeletal: Negative.   Skin: Negative.   Neurological: Negative.   Endo/Heme/Allergies: Negative.   Psychiatric/Behavioral: Negative.     PHYSICAL EXAMINATION:    BP 108/60 (BP Location: Right Arm, Patient  Position: Sitting, Cuff Size: Normal)   Pulse 84   Temp 98.4 F (36.9 C) (Skin)   Wt 122 lb 9.6 oz (55.6 kg)   LMP 05/22/2019 (Exact Date)     General appearance: alert, cooperative and appears stated age Neck: no adenopathy, supple, symmetrical, trachea midline and thyroid normal to inspection and palpation Breasts: normal appearance, no masses or tenderness Heart: regular rate and rhythm Lungs: CTAB Abdomen: soft, non-tender; bowel sounds normal; no masses,  no organomegaly Extremities: normal, atraumatic, no cyanosis Skin: normal color, texture and turgor, no rashes or lesions Lymph: normal cervical supraclavicular and inguinal nodes Neurologic: grossly normal   Pelvic: External genitalia:  no lesions, +erythema              Urethra:  normal appearing urethra with no masses, tenderness or lesions              Bartholins and Skenes: normal                 Vagina: normal appearing vagina with an increase in yellow, frothy vaginal discharge              Cervix: no lesions              Bimanual Exam:  Uterus:  normal size, contour, position, consistency, mobility, non-tender              Adnexa: no mass, fullness, tenderness               Chaperone was present for exam.  On questioning during the exam, she c/o intermittent vulvar irritation.   ASSESSMENT Well woman exam BTB with depo-provera, per review of the chart it appears that she is overdue for her depo-provera Vulvar irritation and vaginal d/c on exam    PLAN UPT negative today Continue depo-provera, 150 mg q 12 weeks x 1 year. Will start after she goes 2 weeks without intercourse and has a negative UPT.  Will treat with OCP's x 2 months (take continuously) STD testing Screening labs Pap next year Discussed breast self exam Discussed calcium and vit D intake Discussed gardasil, she will check if she has had it Affirm     An After Visit Summary was printed and given to the patient.

## 2019-05-26 ENCOUNTER — Ambulatory Visit (INDEPENDENT_AMBULATORY_CARE_PROVIDER_SITE_OTHER): Payer: 59 | Admitting: Obstetrics and Gynecology

## 2019-05-26 ENCOUNTER — Other Ambulatory Visit: Payer: Self-pay

## 2019-05-26 ENCOUNTER — Encounter: Payer: Self-pay | Admitting: Obstetrics and Gynecology

## 2019-05-26 VITALS — BP 108/60 | HR 84 | Temp 98.4°F | Wt 122.6 lb

## 2019-05-26 DIAGNOSIS — Z113 Encounter for screening for infections with a predominantly sexual mode of transmission: Secondary | ICD-10-CM

## 2019-05-26 DIAGNOSIS — Z0000 Encounter for general adult medical examination without abnormal findings: Secondary | ICD-10-CM | POA: Diagnosis not present

## 2019-05-26 DIAGNOSIS — N921 Excessive and frequent menstruation with irregular cycle: Secondary | ICD-10-CM

## 2019-05-26 DIAGNOSIS — Z01419 Encounter for gynecological examination (general) (routine) without abnormal findings: Secondary | ICD-10-CM | POA: Diagnosis not present

## 2019-05-26 DIAGNOSIS — N9089 Other specified noninflammatory disorders of vulva and perineum: Secondary | ICD-10-CM

## 2019-05-26 DIAGNOSIS — N898 Other specified noninflammatory disorders of vagina: Secondary | ICD-10-CM

## 2019-05-26 LAB — POCT URINE PREGNANCY: Preg Test, Ur: NEGATIVE

## 2019-05-26 MED ORDER — NORETHIN ACE-ETH ESTRAD-FE 1-20 MG-MCG PO TABS: ORAL_TABLET | ORAL | 0 refills | Status: DC

## 2019-05-26 NOTE — Patient Instructions (Addendum)
EXERCISE AND DIET:  We recommended that you start or continue a regular exercise program for good health. Regular exercise means any activity that makes your heart beat faster and makes you sweat.  We recommend exercising at least 30 minutes per day at least 3 days a week, preferably 4 or 5.  We also recommend a diet low in fat and sugar.  Inactivity, poor dietary choices and obesity can cause diabetes, heart attack, stroke, and kidney damage, among others.   ° °ALCOHOL AND SMOKING:  Women should limit their alcohol intake to no more than 7 drinks/beers/glasses of wine (combined, not each!) per week. Moderation of alcohol intake to this level decreases your risk of breast cancer and liver damage. And of course, no recreational drugs are part of a healthy lifestyle.  And absolutely no smoking or even second hand smoke. Most people know smoking can cause heart and lung diseases, but did you know it also contributes to weakening of your bones? Aging of your skin?  Yellowing of your teeth and nails? ° °CALCIUM AND VITAMIN D:  Adequate intake of calcium and Vitamin D are recommended.  The recommendations for exact amounts of these supplements seem to change often, but generally speaking 1,000 mg of calcium (between diet and supplement) and 800 units of Vitamin D per day seems prudent. Certain women may benefit from higher intake of Vitamin D.  If you are among these women, your doctor will have told you during your visit.   ° °PAP SMEARS:  Pap smears, to check for cervical cancer or precancers,  have traditionally been done yearly, although recent scientific advances have shown that most women can have pap smears less often.  However, every woman still should have a physical exam from her gynecologist every year. It will include a breast check, inspection of the vulva and vagina to check for abnormal growths or skin changes, a visual exam of the cervix, and then an exam to evaluate the size and shape of the uterus and  ovaries.  And after 20 years of age, a rectal exam is indicated to check for rectal cancers. We will also provide age appropriate advice regarding health maintenance, like when you should have certain vaccines, screening for sexually transmitted diseases, bone density testing, colonoscopy, mammograms, etc.  ° °MAMMOGRAMS:  All women over 40 years old should have a yearly mammogram. Many facilities now offer a "3D" mammogram, which may cost around $50 extra out of pocket. If possible,  we recommend you accept the option to have the 3D mammogram performed.  It both reduces the number of women who will be called back for extra views which then turn out to be normal, and it is better than the routine mammogram at detecting truly abnormal areas.   ° °COLON CANCER SCREENING: Now recommend starting at age 45. At this time colonoscopy is not covered for routine screening until 50. There are take home tests that can be done between 45-49.  ° °COLONOSCOPY:  Colonoscopy to screen for colon cancer is recommended for all women at age 50.  We know, you hate the idea of the prep.  We agree, BUT, having colon cancer and not knowing it is worse!!  Colon cancer so often starts as a polyp that can be seen and removed at colonscopy, which can quite literally save your life!  And if your first colonoscopy is normal and you have no family history of colon cancer, most women don't have to have it again for   10 years.  Once every ten years, you can do something that may end up saving your life, right?  We will be happy to help you get it scheduled when you are ready.  Be sure to check your insurance coverage so you understand how much it will cost.  It may be covered as a preventative service at no cost, but you should check your particular policy.   ° ° ° °Breast Self-Awareness °Breast self-awareness means being familiar with how your breasts look and feel. It involves checking your breasts regularly and reporting any changes to your  health care provider. °Practicing breast self-awareness is important. A change in your breasts can be a sign of a serious medical problem. Being familiar with how your breasts look and feel allows you to find any problems early, when treatment is more likely to be successful. All women should practice breast self-awareness, including women who have had breast implants. °How to do a breast self-exam °One way to learn what is normal for your breasts and whether your breasts are changing is to do a breast self-exam. To do a breast self-exam: °Look for Changes ° °1. Remove all the clothing above your waist. °2. Stand in front of a mirror in a room with good lighting. °3. Put your hands on your hips. °4. Push your hands firmly downward. °5. Compare your breasts in the mirror. Look for differences between them (asymmetry), such as: °? Differences in shape. °? Differences in size. °? Puckers, dips, and bumps in one breast and not the other. °6. Look at each breast for changes in your skin, such as: °? Redness. °? Scaly areas. °7. Look for changes in your nipples, such as: °? Discharge. °? Bleeding. °? Dimpling. °? Redness. °? A change in position. °Feel for Changes °Carefully feel your breasts for lumps and changes. It is best to do this while lying on your back on the floor and again while sitting or standing in the shower or tub with soapy water on your skin. Feel each breast in the following way: °· Place the arm on the side of the breast you are examining above your head. °· Feel your breast with the other hand. °· Start in the nipple area and make ¾ inch (2 cm) overlapping circles to feel your breast. Use the pads of your three middle fingers to do this. Apply light pressure, then medium pressure, then firm pressure. The light pressure will allow you to feel the tissue closest to the skin. The medium pressure will allow you to feel the tissue that is a little deeper. The firm pressure will allow you to feel the tissue  close to the ribs. °· Continue the overlapping circles, moving downward over the breast until you feel your ribs below your breast. °· Move one finger-width toward the center of the body. Continue to use the ¾ inch (2 cm) overlapping circles to feel your breast as you move slowly up toward your collarbone. °· Continue the up and down exam using all three pressures until you reach your armpit. ° °Write Down What You Find ° °Write down what is normal for each breast and any changes that you find. Keep a written record with breast changes or normal findings for each breast. By writing this information down, you do not need to depend only on memory for size, tenderness, or location. Write down where you are in your menstrual cycle, if you are still menstruating. °If you are having trouble noticing differences   in your breasts, do not get discouraged. With time you will become more familiar with the variations in your breasts and more comfortable with the exam. °How often should I examine my breasts? °Examine your breasts every month. If you are breastfeeding, the best time to examine your breasts is after a feeding or after using a breast pump. If you menstruate, the best time to examine your breasts is 5-7 days after your period is over. During your period, your breasts are lumpier, and it may be more difficult to notice changes. °When should I see my health care provider? °See your health care provider if you notice: °· A change in shape or size of your breasts or nipples. °· A change in the skin of your breast or nipples, such as a reddened or scaly area. °· Unusual discharge from your nipples. °· A lump or thick area that was not there before. °· Pain in your breasts. °· Anything that concerns you. ° °Oral Contraception Information °Oral contraceptive pills (OCPs) are medicines taken to prevent pregnancy. OCPs are taken by mouth, and they work by: °· Preventing the ovaries from releasing eggs. °· Thickening mucus in  the lower part of the uterus (cervix), which prevents sperm from entering the uterus. °· Thinning the lining of the uterus (endometrium), which prevents a fertilized egg from attaching to the endometrium. °OCPs are highly effective when taken exactly as prescribed. However, OCPs do not prevent STIs (sexually transmitted infections). Safe sex practices, such as using condoms while on an OCP, can help prevent STIs. °Before starting OCPs °Before you start taking OCPs, you may have a physical exam, blood test, and Pap test. However, you are not required to have a pelvic exam in order to be prescribed OCPs. Your health care provider will make sure you are a good candidate for oral contraception. OCPs are not a good option for certain women, including women who smoke and are older than 35 years, and women with a medical history of high blood pressure, deep vein thrombosis, pulmonary embolism, stroke, cardiovascular disease, or peripheral vascular disease. °Discuss with your health care provider the possible side effects of the OCP you may be prescribed. When you start an OCP, be aware that it can take 2-3 months for your body to adjust to changes in hormone levels. °Follow instructions from your health care provider about how to start taking your first cycle of OCPs. Depending on when you start the pill, you may need to use a backup form of birth control, such as condoms, during the first week. Make sure you know what steps to take if you ever forget to take the pill. °Types of oral contraception ° °The most common types of birth control pills contain the hormones estrogen and progestin (synthetic progesterone) or progestin only. °The combination pill °This type of pill contains estrogen and progestin hormones. Combination pills often come in packs of 21, 28, or 91 pills. For each pack, the last 7 pills may not contain hormones, which means you may stop taking the pills for 7 days. Menstrual bleeding occurs during the  week that you do not take the pills or that you take the pills with no hormones in them. °The minipill °This type of pill contains the progestin hormone only. It comes in packs of 28 pills. All 28 pills contain the hormone. You take the pill every day. It is very important to take the pill at the same time each day. °Advantages of oral contraceptive pills °·   Provides reliable and continuous contraception if taken as instructed. °· May treat or decrease symptoms of: °? Menstrual period cramps. °? Irregular menstrual cycle or bleeding. °? Heavy menstrual flow. °? Abnormal uterine bleeding. °? Acne, depending on the type of pill. °? Polycystic ovarian syndrome. °? Endometriosis. °? Iron deficiency anemia. °? Premenstrual symptoms, including premenstrual dysphoric disorder. °· May reduce the risk of endometrial and ovarian cancer. °· Can be used as emergency contraception. °· Prevents mislocated (ectopic) pregnancies and infections of the fallopian tubes. °Things that can make oral contraceptive pills less effective °OCPs can be less effective if: °· You forget to take the pill at the same time every day. This is especially important when taking the minipill. °· You have a stomach or intestinal disease that reduces your body's ability to absorb the pill. °· You take OCPs with other medicines that make OCPs less effective, such as antibiotics, certain HIV medicines, and some seizure medicines. °· You take expired OCPs. °· You forget to restart the pill on day 7, if using the packs of 21 pills. °Risks associated with oral contraceptive pills °Oral contraceptive pills can sometimes cause side effects, such as: °· Headache. °· Depression. °· Trouble sleeping. °· Nausea and vomiting. °· Breast tenderness. °· Irregular bleeding or spotting during the first several months. °· Bloating or fluid retention. °· Increase in blood pressure. °Combination pills are also associated with a small increase in the risk of: °· Blood  clots. °· Heart attack. °· Stroke. °Summary °· Oral contraceptive pills are medicines taken by mouth to prevent pregnancy. They are highly effective when taken exactly as prescribed. °· The most common types of birth control pills contain the hormones estrogen and progestin (synthetic progesterone) or progestin only. °· Before you start taking the pill, you may have a physical exam, blood test, and Pap test. Your health care provider will make sure you are a good candidate for oral contraception. °· The combination pill may come in a 21-day pack, a 28-day pack, or a 91-day pack. The minipill contains the progesterone hormone only and comes in packs of 28 pills. °· Oral contraceptive pills can sometimes cause side effects, such as headache, nausea, breast tenderness, or irregular bleeding. °This information is not intended to replace advice given to you by your health care provider. Make sure you discuss any questions you have with your health care provider. °Document Released: 12/14/2002 Document Revised: 09/05/2017 Document Reviewed: 12/17/2016 °Elsevier Patient Education © 2020 [employer] ° °

## 2019-05-27 ENCOUNTER — Telehealth: Payer: Self-pay

## 2019-05-27 LAB — COMPREHENSIVE METABOLIC PANEL
ALT: 10 IU/L (ref 0–32)
AST: 17 IU/L (ref 0–40)
Albumin/Globulin Ratio: 1.8 (ref 1.2–2.2)
Albumin: 4.6 g/dL (ref 3.9–5.0)
Alkaline Phosphatase: 70 IU/L (ref 39–117)
BUN/Creatinine Ratio: 10 (ref 9–23)
BUN: 10 mg/dL (ref 6–20)
Bilirubin Total: 0.4 mg/dL (ref 0.0–1.2)
CO2: 23 mmol/L (ref 20–29)
Calcium: 9.6 mg/dL (ref 8.7–10.2)
Chloride: 104 mmol/L (ref 96–106)
Creatinine, Ser: 1.03 mg/dL — ABNORMAL HIGH (ref 0.57–1.00)
GFR calc Af Amer: 90 mL/min/{1.73_m2} (ref >59)
GFR calc non Af Amer: 78 mL/min/{1.73_m2} (ref >59)
Globulin, Total: 2.5 g/dL (ref 1.5–4.5)
Glucose: 88 mg/dL (ref 65–99)
Potassium: 4.2 mmol/L (ref 3.5–5.2)
Sodium: 143 mmol/L (ref 134–144)
Total Protein: 7.1 g/dL (ref 6.0–8.5)

## 2019-05-27 LAB — CBC
Hematocrit: 46.4 % (ref 34.0–46.6)
Hemoglobin: 15.5 g/dL (ref 11.1–15.9)
MCH: 32.2 pg (ref 26.6–33.0)
MCHC: 33.4 g/dL (ref 31.5–35.7)
MCV: 96 fL (ref 79–97)
Platelets: 285 10*3/uL (ref 150–450)
RBC: 4.82 x10E6/uL (ref 3.77–5.28)
RDW: 12.8 % (ref 11.7–15.4)
WBC: 7.7 10*3/uL (ref 3.4–10.8)

## 2019-05-27 LAB — LIPID PANEL
Chol/HDL Ratio: 1.7 ratio (ref 0.0–4.4)
Cholesterol, Total: 105 mg/dL (ref 100–199)
HDL: 61 mg/dL (ref >39)
LDL Calculated: 30 mg/dL (ref 0–99)
Triglycerides: 70 mg/dL (ref 0–149)
VLDL Cholesterol Cal: 14 mg/dL (ref 5–40)

## 2019-05-27 LAB — VAGINITIS/VAGINOSIS, DNA PROBE
Candida Species: NEGATIVE
Gardnerella vaginalis: POSITIVE — AB
Trichomonas vaginosis: POSITIVE — AB

## 2019-05-27 LAB — HEP, RPR, HIV PANEL
HIV Screen 4th Generation wRfx: NONREACTIVE
Hepatitis B Surface Ag: NEGATIVE
RPR Ser Ql: NONREACTIVE

## 2019-05-27 LAB — HEPATITIS C ANTIBODY: Hep C Virus Ab: 0.2 s/co ratio (ref 0.0–0.9)

## 2019-05-27 MED ORDER — METRONIDAZOLE 500 MG PO TABS: 500.0000 mg | ORAL_TABLET | Freq: Two times a day (BID) | ORAL | 0 refills | Status: DC

## 2019-05-27 NOTE — Telephone Encounter (Signed)
-----   Message from Salvadore Dom, MD sent at 05/27/2019  8:17 AM EDT ----- Please inform the patient that she has BV and Trich. Dalbert Batman is sexually transmitted. Please treat her with flagyl 500 mg po BID x 7 days. Offer expedited partner therapy. For him, he can take 500 mg flagyl, 4 tablets po x 1. They should avoid intercourse until one week after they have both completed treatment and then use condoms. Please set her up for repeat testing for trich in one month. HIV and cervical cultures are pending. The rest of her lab work is normal.

## 2019-05-27 NOTE — Telephone Encounter (Signed)
Spoke with patient. Advised of results as seen below from An[facility]. Patient verbalizes understanding. Rx for Rx for Flagyl 500 mg BID x 7 days #14 0 RF sent to pharmacy on file. Marland KitchenAvoid alcohol during treatment and 24 hours after completing medication. Don't mix with alcohol if mixed can cause severe nausea, vomiting and abdominal cramping. Patient verbalizes understanding. Patient declines expedited partner therapy. Patient is aware she will be contacted with the rest of her results when they return. Patient will schedule follow up at that time. Encounter closed.

## 2019-05-29 LAB — GC/CHLAMYDIA PROBE AMP
Chlamydia trachomatis, NAA: NEGATIVE
Neisseria Gonorrhoeae by PCR: NEGATIVE

## 2019-06-09 ENCOUNTER — Ambulatory Visit (INDEPENDENT_AMBULATORY_CARE_PROVIDER_SITE_OTHER): Payer: 59

## 2019-06-09 VITALS — BP 124/68 | HR 74 | Temp 98.2°F | Resp 14 | Ht 62.0 in | Wt 124.0 lb

## 2019-06-09 DIAGNOSIS — Z3042 Encounter for surveillance of injectable contraceptive: Secondary | ICD-10-CM

## 2019-06-09 LAB — POCT URINE PREGNANCY: Preg Test, Ur: NEGATIVE

## 2019-06-09 MED ORDER — MEDROXYPROGESTERONE ACETATE 150 MG/ML IM SUSP: 150.0000 mg | Freq: Once | INTRAMUSCULAR | Status: DC

## 2019-06-09 NOTE — Progress Notes (Signed)
Patient is here to get a urin pregnancy. Test was negative. Patient does not want to get her depo at this time she is taking OCP and likes them. She states she will call back next week to either confirm she wants to continue the pills or schedule her Depo.  Does not wish to refill OCP at this time.   Routing to provider for final review.

## 2019-07-16 ENCOUNTER — Telehealth (INDEPENDENT_AMBULATORY_CARE_PROVIDER_SITE_OTHER): Payer: 59 | Admitting: Family Medicine

## 2019-07-16 ENCOUNTER — Other Ambulatory Visit: Payer: Self-pay

## 2019-07-16 DIAGNOSIS — B354 Tinea corporis: Secondary | ICD-10-CM | POA: Diagnosis not present

## 2019-07-16 MED ORDER — TERBINAFINE HCL 1 % EX CREA: 1.0000 "application " | TOPICAL_CREAM | Freq: Two times a day (BID) | CUTANEOUS | 0 refills | Status: DC

## 2019-07-16 NOTE — Progress Notes (Signed)
Virtual Visit via Video Note  I connected with Janice Sutton on 07/16/19 at  3:30 PM EDT by a video enabled telemedicine application 2/2 [id number] pandemic and verified that I am speaking with the correct person using two identifiers.  Location patient: home Location provider:work or home office Persons participating in the virtual visit: patient, provider  I discussed the limitations of evaluation and management by telemedicine and the availability of in person appointments. The patient expressed understanding and agreed to proceed.   HPI: Pt with rash on L lower abdomen x 3 wks.  Pt states it is pruritic.  Pt feels like the area has become larger.  Pt tried cortisone cream off and on.  Pt has a pet dog who is without rash/skin irritation.  Dog recently seen by the Vet.  Pt also notes she spent the night at a friend's house, but the friend does not have any rash.  Pt denies fever, chills, n/v.   ROS: See pertinent positives and negatives per HPI.  Past Medical History:  Diagnosis Date  . ADD (attention deficit disorder)   . STD (sexually transmitted disease) 07/2016   Chlamydia     No past surgical history on file.  Family History  Problem Relation Age of Onset  . Asthma Mother      Current Outpatient Medications:  .  medroxyPROGESTERone (DEPO-PROVERA) 150 MG/ML injection, Inject 150 mg into the muscle every 3 (three) months., Disp: , Rfl:  .  metroNIDAZOLE (FLAGYL) 500 MG tablet, Take 1 tablet (500 mg total) by mouth 2 (two) times daily., Disp: 14 tablet, Rfl: 0 .  norethindrone-ethinyl estradiol (LOESTRIN FE) 1-20 MG-MCG tablet, Take continuously, skip placebo week, Disp: 2 Package, Rfl: 0  EXAM:  VITALS per patient if applicable:  GENERAL: alert, oriented, appears well and in no acute distress  HEENT: atraumatic, conjunctiva clear, no obvious abnormalities on inspection of external nose and ears  NECK: normal movements of the head and neck  LUNGS: on inspection no  signs of respiratory distress, breathing rate appears normal, no obvious gross SOB, gasping or wheezing  CV: no obvious cyanosis  SKIN: Oval shaped erythematous lesion on L lateral abdomen with dry appearing center. No surrounding erythema or lesions.  No drainage noted.  MS: moves all visible extremities without noticeable abnormality  PSYCH/NEURO: pleasant and cooperative, no obvious depression or anxiety, speech and thought processing grossly intact  ASSESSMENT AND PLAN:  Discussed the following assessment and plan:  Tinea corporis  -advised to avoid scratching. -ok to use benadryl or benadryl cream if needed for itching. - Plan: terbinafine (LAMISIL) 1 % cream  F/u prn with pcp    I discussed the assessment and treatment plan with the patient. The patient was provided an opportunity to ask questions and all were answered. The patient agreed with the plan and demonstrated an understanding of the instructions.   The patient was advised to call back or seek an in-person evaluation if the symptoms worsen or if the condition fails to improve as anticipated.   Billie Ruddy, MD

## 2019-08-03 NOTE — Progress Notes (Signed)
No show for virtual visit  

## 2019-08-04 ENCOUNTER — Encounter: Payer: 59 | Admitting: Internal Medicine

## 2019-08-04 ENCOUNTER — Other Ambulatory Visit: Payer: Self-pay

## 2019-08-09 ENCOUNTER — Telehealth: Payer: Self-pay | Admitting: Obstetrics and Gynecology

## 2019-08-09 NOTE — Telephone Encounter (Signed)
Patient has a discharge and odor.

## 2019-08-09 NOTE — Telephone Encounter (Signed)
Spoke with patient. Reports creamy white vaginal d/c, with odor. Not fishy or foul. Started on 08/08/19. LMP 07/21/19. Denies pain or STD concerns.   [id number] prescreen completed, precautions reviewed.  OV scheduled for 11/4 at 4:30pm with Dr. Talbert Nan.   Last AEX 05/26/19  Routing to provider for final review. Patient is agreeable to disposition. Will close encounter.

## 2019-08-10 ENCOUNTER — Encounter: Payer: Self-pay | Admitting: Obstetrics and Gynecology

## 2019-08-10 ENCOUNTER — Ambulatory Visit (INDEPENDENT_AMBULATORY_CARE_PROVIDER_SITE_OTHER): Payer: 59 | Admitting: Obstetrics and Gynecology

## 2019-08-10 ENCOUNTER — Other Ambulatory Visit: Payer: Self-pay

## 2019-08-10 VITALS — BP 118/60 | HR 68 | Temp 97.5°F | Ht 62.0 in | Wt 124.0 lb

## 2019-08-10 DIAGNOSIS — N760 Acute vaginitis: Secondary | ICD-10-CM | POA: Diagnosis not present

## 2019-08-10 DIAGNOSIS — Z3009 Encounter for other general counseling and advice on contraception: Secondary | ICD-10-CM

## 2019-08-10 DIAGNOSIS — Z8619 Personal history of other infectious and parasitic diseases: Secondary | ICD-10-CM

## 2019-08-10 DIAGNOSIS — B9689 Other specified bacterial agents as the cause of diseases classified elsewhere: Secondary | ICD-10-CM

## 2019-08-10 DIAGNOSIS — Z3202 Encounter for pregnancy test, result negative: Secondary | ICD-10-CM | POA: Diagnosis not present

## 2019-08-10 DIAGNOSIS — N898 Other specified noninflammatory disorders of vagina: Secondary | ICD-10-CM

## 2019-08-10 DIAGNOSIS — N926 Irregular menstruation, unspecified: Secondary | ICD-10-CM | POA: Diagnosis not present

## 2019-08-10 DIAGNOSIS — Z113 Encounter for screening for infections with a predominantly sexual mode of transmission: Secondary | ICD-10-CM

## 2019-08-10 LAB — POCT URINE PREGNANCY: Preg Test, Ur: NEGATIVE

## 2019-08-10 MED ORDER — METRONIDAZOLE 0.75 % VA GEL: 1.0000 | Freq: Every day | VAGINAL | 0 refills | Status: DC

## 2019-08-10 NOTE — Patient Instructions (Addendum)
Trichomoniasis Trichomoniasis is an STI (sexually transmitted infection) that can affect both women and men. In women, the outer area of the female genitalia (vulva) and the vagina are affected. In men, mainly the penis is affected, but the prostate and other reproductive organs can also be involved.  This condition can be treated with medicine. It often has no symptoms (is asymptomatic), especially in men. If not treated, trichomoniasis can last for months or years. What are the causes? This condition is caused by a parasite called Trichomonas vaginalis. Trichomoniasis most often spreads from person to person (is contagious) through sexual contact. What increases the risk? The following factors may make you more likely to develop this condition:  Having unprotected sex.  Having sex with a partner who has trichomoniasis. Having Vaginitis Vaginitis is a condition in which the vaginal tissue swells and becomes red (inflamed). This condition is most often caused by a change in the normal balance of bacteria and yeast that live in the vagina. This change causes an overgrowth of certain bacteria or yeast, which causes the inflammation. There are different types of vaginitis, but the most common types are: Bacterial vaginosis. Yeast infection (candidiasis). Trichomoniasis vaginitis. This is a sexually transmitted disease (STD). Viral vaginitis. Atrophic vaginitis. Allergic vaginitis. What are the causes? The cause of this condition depends on the type of vaginitis. It can be caused by: Bacteria (bacterial vaginosis). Yeast, which is a fungus (yeast infection). A parasite (trichomoniasis vaginitis). A virus (viral vaginitis). Low hormone levels (atrophic vaginitis). Low hormone levels can occur during pregnancy, breastfeeding, or after menopause. Irritants, such as bubble baths, scented tampons, and feminine sprays (allergic vaginitis). Other factors can change the normal balance of the yeast  and bacteria that live in the vagina. These include: Antibiotic medicines. Poor hygiene. Diaphragms, vaginal sponges, spermicides, birth control pills, and intrauterine devices (IUD). Sex. Infection. Uncontrolled diabetes. A weakened defense (immune) system. What increases the risk? This condition is more likely to develop in women who: Smoke. Use vaginal douches, scented tampons, or scented sanitary pads. Wear tight-fitting pants. Wear thong underwear. Use oral birth control pills or an IUD. Have sex without a condom. Have multiple sex partners. Have an STD. Frequently use the spermicide nonoxynol-9. Eat lots of foods high in sugar. Have uncontrolled diabetes. Have low estrogen levels. Have a weakened immune system from an immune disorder or medical treatment. Are pregnant or breastfeeding. What are the signs or symptoms? Symptoms vary depending on the cause of the vaginitis. Common symptoms include: Abnormal vaginal discharge. The discharge is white, gray, or yellow with bacterial vaginosis. The discharge is thick, white, and cheesy with a yeast infection. The discharge is frothy and yellow or greenish with trichomoniasis. A bad vaginal smell. The smell is fishy with bacterial vaginosis. Vaginal itching, pain, or swelling. Sex that is painful. Pain or burning when urinating. Sometimes there are no symptoms. How is this diagnosed? This condition is diagnosed based on your symptoms and medical history. A physical exam, including a pelvic exam, will also be done. You may also have other tests, including: Tests to determine the pH level (acidity or alkalinity) of your vagina. A whiff test, to assess the odor that results when a sample of your vaginal discharge is mixed with a potassium hydroxide solution. Tests of vaginal fluid. A sample will be examined under a microscope. How is this treated? Treatment varies depending on the type of vaginitis you have. Your treatment may  include: Antibiotic creams or pills to treat bacterial vaginosis  and trichomoniasis. Antifungal medicines, such as vaginal creams or suppositories, to treat a yeast infection. Medicine to ease discomfort if you have viral vaginitis. Your sexual partner should also be treated. Estrogen delivered in a cream, pill, suppository, or vaginal ring to treat atrophic vaginitis. If vaginal dryness occurs, lubricants and moisturizing creams may help. You may need to avoid scented soaps, sprays, or douches. Stopping use of a product that is causing allergic vaginitis. Then using a vaginal cream to treat the symptoms. Follow these instructions at home: Lifestyle Keep your genital area clean and dry. Avoid soap, and only rinse the area with water. Do not douche or use tampons until your health care provider says it is okay to do so. Use sanitary pads, if needed. Do not have sex until your health care provider approves. When you can return to sex, practice safe sex and use condoms. Wipe from front to back. This avoids the spread of bacteria from the rectum to the vagina. General instructions Take over-the-counter and prescription medicines only as told by your health care provider. If you were prescribed an antibiotic medicine, take or use it as told by your health care provider. Do not stop taking or using the antibiotic even if you start to feel better. Keep all follow-up visits as told by your health care provider. This is important. How is this prevented? Use mild, non-scented products. Do not use things that can irritate the vagina, such as fabric softeners. Avoid the following products if they are scented: Feminine sprays. Detergents. Tampons. Feminine hygiene products. Soaps or bubble baths. Let air reach your genital area. Wear cotton underwear to reduce moisture buildup. Avoid wearing underwear while you sleep. Avoid wearing tight pants and underwear or nylons without a cotton panel. Avoid  wearing thong underwear. Take off any wet clothing, such as bathing suits, as soon as possible. Practice safe sex and use condoms. Contact a health care provider if: You have abdominal pain. You have a fever. You have symptoms that last for more than 2-3 days. Get help right away if: You have a fever and your symptoms suddenly get worse. Summary Vaginitis is a condition in which the vaginal tissue becomes inflamed.This condition is most often caused by a change in the normal balance of bacteria and yeast that live in the vagina. Treatment varies depending on the type of vaginitis you have. Do not douche, use tampons , or have sex until your health care provider approves. When you can return to sex, practice safe sex and use condoms. This information is not intended to replace advice given to you by your health care provider. Make sure you discuss any questions you have with your health care provider. Document Released: 07/21/2007 Document Revised: 09/05/2017 Document Reviewed: 10/29/2016 Elsevier Patient Education  2020 [employer].   multiple sexual partners.  Having had previous trichomoniasis infections or other STIs. What are the signs or symptoms? In women, symptoms of trichomoniasis include:  Abnormal vaginal discharge that is clear, white, gray, or yellow-green and foamy and has an unusual "fishy" odor.  Itching and irritation of the vagina and vulva.  Burning or pain during urination or sex.  Redness and swelling of the genitals. In men, symptoms of trichomoniasis include:  Penile discharge that may be foamy or contain pus.  Pain in the penis. This may happen only when urinating.  Itching or irritation inside the penis.  Burning after urination or ejaculation. How is this diagnosed? In women, this condition may be found during  a routine Pap test or physical exam. It may be found in men during a routine physical exam. Your health care provider may do tests to help  diagnose this infection, such as:  Urine tests (men and women).  The following in women: ? Testing the pH of the vagina. ? A vaginal swab test that checks for the Trichomonas vaginalis parasite. ? Testing vaginal secretions. Your health care provider may test you for other STIs, including HIV (human immunodeficiency virus). How is this treated? This condition is treated with medicine taken by mouth (orally), such as metronidazole or tinidazole, to fight the infection. Your sexual partner(s) also need to be tested and treated.  If you are a woman and you plan to become pregnant or think you may be pregnant, tell your health care provider right away. Some medicines that are used to treat the infection should not be taken during pregnancy. Your health care provider may recommend over-the-counter medicines or creams to help relieve itching or irritation. You may be tested for infection again 3 months after treatment. Follow these instructions at home:  Take and use over-the-counter and prescription medicines, including creams, only as told by your health care provider.  Take your antibiotic medicine as told by your health care provider. Do not stop taking the antibiotic even if you start to feel better.  Do not have sex until 7-10 days after you finish your medicine, or until your health care provider approves. Ask your health care provider when you may start to have sex again.  (Women) Do not douche or wear tampons while you have the infection.  Discuss your infection with your sexual partner(s). Make sure that your partner gets tested and treated, if necessary.  Keep all follow-up visits as told by your health care provider. This is important. How is this prevented?   Use condoms every time you have sex. Using condoms correctly and consistently can help protect against STIs.  Avoid having multiple sexual partners.  Talk with your sexual partner about any symptoms that either of you  may have, as well as any history of STIs.  Get tested for STIs and STDs (sexually transmitted diseases) before you have sex. Ask your partner to do the same.  Do not have sexual contact if you have symptoms of trichomoniasis or another STI. Contact a health care provider if:  You still have symptoms after you finish your medicine.  You develop pain in your abdomen.  You have pain when you urinate.  You have bleeding after sex.  You develop a rash.  You feel nauseous or you vomit.  You plan to become pregnant or think you may be pregnant. Summary  Trichomoniasis is an STI (sexually transmitted infection) that can affect both women and men.  This condition often has no symptoms (is asymptomatic), especially in men.  Without treatment, this condition can last for months or years.  You should not have sex until 7-10 days after you finish your medicine, or until your health care provider approves. Ask your health care provider when you may start to have sex again.  Discuss your infection with your sexual partner(s). Make sure that your partner gets tested and treated, if necessary. This information is not intended to replace advice given to you by your health care provider. Make sure you discuss any questions you have with your health care provider. Document Released: 03/19/2001 Document Revised: 07/07/2018 Document Reviewed: 07/07/2018 Elsevier Patient Education  2020 [employer]

## 2019-08-10 NOTE — Progress Notes (Signed)
GYNECOLOGY  VISIT   HPI: 18 y.o.   Single Black or African American Not Hispanic or Latino  female   [id number] with Patient's last menstrual period was 07/26/2019 (exact date).   here for  Vaginal discharge with slight odor. The d/c is creamy and white. No itching, burning or irritation.   She had trich in August, hasn't had a TOC. She is with the same partner, he was treated. They aren't using condoms.   She is requesting UPT.  Patient states she can't eat, she c/o nausea for the last month. Can't eat like she used to. No stomach pain. She started off of OCP's.   The patient was on depo-provera for years. She started having BTB last spring and was started on OCP's, not taking them. She has a 3 month supply and will start it on the first day of her cycle.   GYNECOLOGIC HISTORY: Patient's last menstrual period was 07/26/2019 (exact date). Contraception:none Menopausal hormone therapy: none        OB History    Gravida  0   Para  0   Term  0   Preterm  0   AB  0   Living  0     SAB  0   TAB  0   Ectopic  0   Multiple  0   Live Births                 Patient Active Problem List   Diagnosis Date Noted  . Depo-Provera contraceptive status 01/18/2019  . History of chlamydia 01/18/2019  . Dysmenorrhea 07/24/2014  . Irregular menstrual cycle 07/24/2014  . History of ADHD 04/16/2013  . ADHD (attention deficit hyperactivity disorder) 04/16/2013    Past Medical History:  Diagnosis Date  . ADD (attention deficit disorder)   . STD (sexually transmitted disease) 07/2016   Chlamydia     No past surgical history on file.  Current Outpatient Medications  Medication Sig Dispense Refill  . medroxyPROGESTERone (DEPO-PROVERA) 150 MG/ML injection Inject 150 mg into the muscle every 3 (three) months.    . norethindrone-ethinyl estradiol (LOESTRIN FE) 1-20 MG-MCG tablet Take continuously, skip placebo week (Patient not taking: Reported on 08/10/2019) 2 Package 0  .  terbinafine (LAMISIL) 1 % cream Apply 1 application topically 2 (two) times daily. (Patient not taking: Reported on 08/10/2019) 30 g 0   No current facility-administered medications for this visit.      ALLERGIES: Patient has no known allergies.  Family History  Problem Relation Age of Onset  . Asthma Mother     Social History   Socioeconomic History  . Marital status: Single    Spouse name: Not on file  . Number of children: Not on file  . Years of education: Not on file  . Highest education level: Not on file  Occupational History  . Not on file  Social Needs  . Financial resource strain: Not on file  . Food insecurity    Worry: Not on file    Inability: Not on file  . Transportation needs    Medical: Not on file    Non-medical: Not on file  Tobacco Use  . Smoking status: Light Tobacco Smoker  . Smokeless tobacco: Never Used  Substance and Sexual Activity  . Alcohol use: No  . Drug use: No  . Sexual activity: Yes    Partners: Male    Birth control/protection: Injection  Lifestyle  . Physical activity    Days per  week: Not on file    Minutes per session: Not on file  . Stress: Not on file  Relationships  . Social Herbalist on phone: Not on file    Gets together: Not on file    Attends religious service: Not on file    Active member of club or organization: Not on file    Attends meetings of clubs or organizations: Not on file    Relationship status: Not on file  . Intimate partner violence    Fear of current or ex partner: Not on file    Emotionally abused: Not on file    Physically abused: Not on file    Forced sexual activity: Not on file  Other Topics Concern  . Not on file  Social History Narrative   2 people living in the home. 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   PHYSICAL EXAMINATION:    BP 118/60   Pulse 68   Temp (!) 97.5 F (36.4 C)   Ht 5\' 2"  (1.575 m)   Wt 124 lb (56.2 kg)   LMP 07/26/2019 (Exact Date)   SpO2 98%   BMI 22.68 kg/m     General appearance: alert, cooperative and appears stated age  Pelvic: External genitalia:  no lesions, mild erythema (just used Carlton Adam)              Urethra:  normal appearing urethra with no masses, tenderness or lesions              Bartholins and Skenes: normal                 Vagina: normal appearing vagina with an increase in watery, white, frothy vaginal discharge.              Cervix: no lesions               Chaperone was present for exam.  Wet prep: ++ clue, no trich, rare wbc KOH: no yeast PH: 5   ASSESSMENT Vaginal discharge, Bacterial vaginitis H/O trich in 8/20 Contraception, she has 3 months of pills, will consider the nuvaring or the depoprovera Nausea    PLAN Treat with flagyl Nuswab for GC/CT/Trich, she declines blood work Condom use encouraged Information on the nuvaring given. She will restart her pills on the first day of her cycle UPT negative No weight loss since 8/20, she will f/u with her primary for the nausea.    An After Visit Summary was printed and given to the patient.  ~20 minutes face to face time of which over 50% was spent in counseling.

## 2019-08-11 ENCOUNTER — Ambulatory Visit: Payer: Self-pay | Admitting: Obstetrics and Gynecology

## 2019-08-11 NOTE — Addendum Note (Signed)
Addended by: Dorothy Spark on: 08/11/2019 09:14 AM   Modules accepted: Orders

## 2019-08-13 LAB — CHLAMYDIA/GONOCOCCUS/TRICHOMONAS, NAA
Chlamydia by NAA: NEGATIVE
Gonococcus by NAA: NEGATIVE
Trich vag by NAA: NEGATIVE

## 2019-08-20 ENCOUNTER — Encounter: Payer: Self-pay | Admitting: Certified Nurse Midwife

## 2019-08-20 ENCOUNTER — Ambulatory Visit (INDEPENDENT_AMBULATORY_CARE_PROVIDER_SITE_OTHER): Payer: 59 | Admitting: Certified Nurse Midwife

## 2019-08-20 ENCOUNTER — Other Ambulatory Visit: Payer: Self-pay

## 2019-08-20 VITALS — BP 106/64 | HR 64 | Temp 97.2°F | Resp 16 | Wt 121.0 lb

## 2019-08-20 DIAGNOSIS — Z3201 Encounter for pregnancy test, result positive: Secondary | ICD-10-CM

## 2019-08-20 DIAGNOSIS — N912 Amenorrhea, unspecified: Secondary | ICD-10-CM

## 2019-08-20 NOTE — Patient Instructions (Signed)

## 2019-08-20 NOTE — Progress Notes (Signed)
18 y.o. single race female g0p0 presents with amenorrhea with + UPT on 08/20/2019. LMP 07/21/2019 through 07/26/2019. Not planned pregnancy, she had just restarted her OCP and not completed the pack yet. No condom use.  Complaining of breast tenderness in the past week with some fatigue, slight nausea. Denies bleeding or cramping. Medications she is taking are: none now, previous OCP, but stopped once she had + UPT.  . Patient has not consumed alcohol since + UPT. Partner unaware yet, she has concerns of when she was sexually active with conception date. Denies any other issues today. Has not started prenatal vitamins yet. Family supportive.  Review of Systems  Constitutional:       Slight fatigue  HENT: Negative.   Eyes: Negative.   Respiratory: Negative.   Cardiovascular: Negative.   Gastrointestinal: Positive for nausea. Negative for abdominal pain and vomiting.       Slight nausea  Genitourinary: Negative.   Musculoskeletal: Negative.   Skin: Negative.   Neurological: Negative.   Endo/Heme/Allergies: Negative.   Psychiatric/Behavioral: Negative.     O: HPI pertinent to above. Healthy WDWN female Affect: normal, orientation x 3  Last Aex:05/26/2019 see office note regarding OCP use Pap smear: none due to age            [place] screen:none  A: Amenorrhea with positive UPT  4+ 1days per LNMP with EDC7/20/2021 Unplanned pregnancy   P: Reviewed with patient importance of prenatal care during pregnancy. Given OB provider list. Reviewed nutrition importance of pregnancy and selecting from all food groups and making sure to have adequate protein intake daily. Discussed avoiding raw or exotic fish, soft cheeses due to risk of bacteria . Discussed concerns with FAS with alcohol use in pregnancy. Discussed increase of IUGR and SIDS with smoking use or second smoke. Reviewed warning signs of early pregnancy and need to advise if occurs. Discussed comfort measures for early pregnancy changes.  Offered viability PUS here prior to initiating prenatal care. Patient would like to have PUS here for date confirmation.Marland Kitchen She will be called with insurance information and scheduled. Questions addressed at length.  Labs: +UPT  Rv prn  Time spent with patient in face to face counseling regarding pregnancy and prenatal care 28 minutes

## 2019-08-21 ENCOUNTER — Other Ambulatory Visit: Payer: Self-pay | Admitting: Certified Nurse Midwife

## 2019-08-23 ENCOUNTER — Telehealth: Payer: Self-pay | Admitting: *Deleted

## 2019-08-23 DIAGNOSIS — N912 Amenorrhea, unspecified: Secondary | ICD-10-CM

## 2019-08-23 DIAGNOSIS — Z3201 Encounter for pregnancy test, result positive: Secondary | ICD-10-CM

## 2019-08-23 LAB — POCT URINE PREGNANCY: Preg Test, Ur: POSITIVE — AB

## 2019-08-23 NOTE — Telephone Encounter (Signed)
-----   Message from Regina Eck, CNM sent at 08/21/2019  3:36 PM EST ----- Patient desires PUS with pregnancy. She will need to wait at least two weeks due to so early. She is aware she will be called with information and insurance info.

## 2019-08-23 NOTE — Telephone Encounter (Signed)
Patient is returning call to Jill. °

## 2019-08-23 NOTE — Telephone Encounter (Signed)
Spoke with patient, advised per Melvia Heaps, CNM.   Patient denies vaginal bleeding or pain.   PUS scheduled for 09/07/19 at 1pm, consult to follow at 1:30pm with Dr. Talbert Nan.   Order placed for precert.   Patient aware to call with any concerns.   Routing to provider for final review. Patient is agreeable to disposition. Will close encounter.  Cc: Dr. Talbert Nan, [facility], [address]

## 2019-08-23 NOTE — Telephone Encounter (Signed)
Call placed to patient, no answer, voicemail not set up.    Order placed for PUS with Dr. Talbert Nan -schedule for 2 wks out

## 2019-08-23 NOTE — Addendum Note (Signed)
Addended by: Susy Manor on: 08/23/2019 01:08 PM   Modules accepted: Orders

## 2019-08-31 ENCOUNTER — Telehealth: Payer: Self-pay | Admitting: Obstetrics and Gynecology

## 2019-08-31 NOTE — Telephone Encounter (Signed)
Call placed to patient to review benefit for scheduled ultrasound appointment on 09/07/2019. Unable to leave a message, as the voicemail box is not set up

## 2019-09-07 ENCOUNTER — Other Ambulatory Visit: Payer: 59 | Admitting: Obstetrics and Gynecology

## 2019-09-07 ENCOUNTER — Telehealth: Payer: Self-pay | Admitting: Obstetrics and Gynecology

## 2019-09-07 ENCOUNTER — Other Ambulatory Visit: Payer: 59

## 2019-09-07 DIAGNOSIS — N898 Other specified noninflammatory disorders of vagina: Secondary | ICD-10-CM

## 2019-09-07 NOTE — Telephone Encounter (Signed)
Patient arrived for ultrasound appointment unprepared financially. Will discuss rescheduling or establish care with OB.

## 2019-09-07 NOTE — Telephone Encounter (Signed)
Patient cancelled today's ultrasound appointment due to financial reasons. Can discuss rescheduling or establish care with OB. Patient would also like to know if there is a different cream she can be prescribed for BV. States the one previously called in is too expensive. Would not like a pill.

## 2019-09-07 NOTE — Telephone Encounter (Signed)
I think she should be able to schedule PUS this week or next??

## 2019-09-07 NOTE — Progress Notes (Deleted)
GYNECOLOGY  VISIT   HPI: 18 y.o.   Single Black or African American Not Hispanic or Latino  female   G1P0000 with Patient's last menstrual period was 07/26/2019 (exact date).   here for     GYNECOLOGIC HISTORY: Patient's last menstrual period was 07/26/2019 (exact date). Contraception:*** Menopausal hormone therapy: ***        OB History    Gravida  1   Para  0   Term  0   Preterm  0   AB  0   Living  0     SAB  0   TAB  0   Ectopic  0   Multiple  0   Live Births                 Patient Active Problem List   Diagnosis Date Noted  . Depo-Provera contraceptive status 01/18/2019  . History of chlamydia 01/18/2019  . Dysmenorrhea 07/24/2014  . Irregular menstrual cycle 07/24/2014  . History of ADHD 04/16/2013  . ADHD (attention deficit hyperactivity disorder) 04/16/2013    Past Medical History:  Diagnosis Date  . ADD (attention deficit disorder)   . STD (sexually transmitted disease) 07/2016   Chlamydia     No past surgical history on file.  No current outpatient medications on file.   No current facility-administered medications for this visit.      ALLERGIES: Patient has no known allergies.  Family History  Problem Relation Age of Onset  . Asthma Mother     Social History   Socioeconomic History  . Marital status: Single    Spouse name: Not on file  . Number of children: Not on file  . Years of education: Not on file  . Highest education level: Not on file  Occupational History  . Not on file  Social Needs  . Financial resource strain: Not on file  . Food insecurity    Worry: Not on file    Inability: Not on file  . Transportation needs    Medical: Not on file    Non-medical: Not on file  Tobacco Use  . Smoking status: Light Tobacco Smoker  . Smokeless tobacco: Never Used  Substance and Sexual Activity  . Alcohol use: No  . Drug use: No  . Sexual activity: Yes    Partners: Male    Birth control/protection: None   Lifestyle  . Physical activity    Days per week: Not on file    Minutes per session: Not on file  . Stress: Not on file  Relationships  . Social Musician on phone: Not on file    Gets together: Not on file    Attends religious service: Not on file    Active member of club or organization: Not on file    Attends meetings of clubs or organizations: Not on file    Relationship status: Not on file  . Intimate partner violence    Fear of current or ex partner: Not on file    Emotionally abused: Not on file    Physically abused: Not on file    Forced sexual activity: Not on file  Other Topics Concern  . Not on file  Social History Narrative   2 people living in the home. Mother and teen    pet yorkipoo   Moved from [place]   Father high school incarcerated    mother  [employer]atsuko Bizzarro ;history of asthma; MBA accounting and finance  Negative ETS /FA    ROS  PHYSICAL EXAMINATION:    LMP 07/26/2019 (Exact Date)     General appearance: alert, cooperative and appears stated age Neck: no adenopathy, supple, symmetrical, trachea midline and thyroid {CHL AMB PHY EX THYROID NORM DEFAULT:[id number]::"normal to inspection and palpation"} Breasts: {Exam; breast:13139::"normal appearance, no masses or tenderness"} Abdomen: soft, non-tender; non distended, no masses,  no organomegaly  Pelvic: External genitalia:  no lesions              Urethra:  normal appearing urethra with no masses, tenderness or lesions              Bartholins and Skenes: normal                 Vagina: normal appearing vagina with normal color and discharge, no lesions              Cervix: {CHL AMB PHY EX CERVIX NORM DEFAULT:[phone]::"no lesions"}              Bimanual Exam:  Uterus:  {CHL AMB PHY EX UTERUS NORM [phone]::"normal size, contour, position, consistency, mobility, non-tender"}              Adnexa: {CHL AMB PHY EX ADNEXA NO MASS DEFAULT:[phone]::"no mass, fullness,  tenderness"}              Rectovaginal: {yes no:[id number]}.  Confirms.              Anus:  normal sphincter tone, no lesions  Chaperone was present for exam.  ASSESSMENT     PLAN    An After Visit Summary was printed and given to the patient.  *** minutes face to face time of which over 50% was spent in counseling.

## 2019-09-07 NOTE — Telephone Encounter (Signed)
Per review of 08/20/19 OV notes, patient offered viability PUS with [facility] or establish care with OB.   Patient was seen in office on 08/10/19, positive for BV, rx for metrogel was sent.   Please advise on [place] follow-up and BV treatment?   Routing to Melvia Heaps, CNM Cc: Dr. Talbert Nan

## 2019-09-08 MED ORDER — METRONIDAZOLE 500 MG PO TABS: 500.0000 mg | ORAL_TABLET | Freq: Two times a day (BID) | ORAL | 0 refills | Status: AC

## 2019-09-08 NOTE — Telephone Encounter (Signed)
Call placed to patient, no answer. Voicemail not set up, unable to leave message.    Call reviewed with Melvia Heaps, CNM. Patient can r/s PUS with [facility] or may establish care with OB/GYN. Does not recommend Flagyl with early pregnancy.

## 2019-09-08 NOTE — Telephone Encounter (Signed)
Spoke with pt. Pt agrees with taking oral flagyl for BV treatment. Pt still having sx of discharge and odor. Pt aware of no other topical treatment. Sent Rx to pharmacy on file per Dr Albertina Senegal orders.   Pt will call back about r/s PUS today.  Will route to Dr Talbert Nan and D.Leonard, CNM for review.

## 2019-09-08 NOTE — Telephone Encounter (Signed)
She can use flagyl if she is having symptoms, not contraindicated in early pregnancy. I would recommend oral flagyl 500 mg bid x 7 days. She could use metrogel, but oral medication is preferred. I understand the metrogel is expensive. There is not another topical treatment.  CC: Janice Sutton, CNM

## 2019-09-08 NOTE — Telephone Encounter (Signed)
Spoke back with pt. Pt states planning on using OB to get PUS that will maybe cover cost. Pt hasn't decided at this time which one, but will call and let us know so we can put in chart. Pt agreeable. Pt at pharmacy picking up Rx for Flagyl and all questions answered. Pt educated on using Dove soap, no underwear at night and eating yogurt to try and help with reoccurrence of BV. Pt verbalized understanding.   Will route to D. Hollice Espy, CNM for review and will close encounter.

## 2019-09-28 ENCOUNTER — Encounter: Payer: Self-pay | Admitting: Family Medicine

## 2019-09-28 ENCOUNTER — Other Ambulatory Visit: Payer: Self-pay

## 2019-09-28 ENCOUNTER — Telehealth (INDEPENDENT_AMBULATORY_CARE_PROVIDER_SITE_OTHER): Payer: 59 | Admitting: Family Medicine

## 2019-09-28 DIAGNOSIS — L989 Disorder of the skin and subcutaneous tissue, unspecified: Secondary | ICD-10-CM

## 2019-09-28 NOTE — Progress Notes (Signed)
Virtual Visit via Video Note  I connected with Janice Sutton  on 09/28/19 at  3:00 PM EST by a video enabled telemedicine application and verified that I am speaking with the correct person using two identifiers.  Location patient: home Location provider:work or home office Persons participating in the virtual visit: patient, provider  I discussed the limitations of evaluation and management by telemedicine and the availability of in person appointments. The patient expressed understanding and agreed to proceed.   HPI:  Acute visit for a rash: -for several months, she thinks since the summer -was advised to use an antifungal cream and hydrocortisone cream - this did not help -it is itchy -has a dog -nobody else at home has any skin issues, but she developed this after visiting folks in [place]   ROS: See pertinent positives and negatives per HPI.  Past Medical History:  Diagnosis Date  . ADD (attention deficit disorder)   . STD (sexually transmitted disease) 07/2016   Chlamydia     No past surgical history on file.  Family History  Problem Relation Age of Onset  . Asthma Mother     SOCIAL HX: see hpi  No current outpatient medications on file.  EXAM:  VITALS per patient if applicable:  GENERAL: alert, oriented, appears well and in no acute distress  HEENT: atraumatic, conjunttiva clear, no obvious abnormalities on inspection of external nose and ears  NECK: normal movements of the head and neck  LUNGS: on inspection no signs of respiratory distress, breathing rate appears normal, no obvious gross SOB, gasping or wheezing  CV: no obvious cyanosis  SKIN: camera focus is not great - but can see and irregular hyperpigmented patch on the thigh with a small patch nearby, outer edges appear papular and pt reports outer edge is raised.  MS: moves all visible extremities without noticeable abnormality  PSYCH/NEURO: pleasant and cooperative, no obvious depression or  anxiety, speech and thought processing grossly intact  ASSESSMENT AND PLAN:  Discussed the following assessment and plan:  Skin lesion  -we discussed possible serious and likely etiologies, options for evaluation and workup, limitations of telemedicine visit vs in person visit, treatment, treatment risks and precautions.Query fungal vs other. She reports she used over the counter antifungal twice daily for several weeks without any improvement. Advised needs in person exam to confirm likely dx with better exam and if appears to be fungal may need labs to initiate oral antifungal vs referral to dermatology for biopsy if dx not confirmed. She agrees and message sent to scheduling to assist with appt this week if possible. Topical lotrimin in the interim bid advised.   I discussed the assessment and treatment plan with the patient. The patient was provided an opportunity to ask questions and all were answered. The patient agreed with the plan and demonstrated an understanding of the instructions.   The patient was advised to call back or seek an in-person evaluation if the symptoms worsen or if the condition fails to improve as anticipated.   Lucretia Kern, DO

## 2019-09-28 NOTE — Patient Instructions (Signed)
Please schedule an in person evaluation for the persistent skin issues in the next 1 week.  Can try Over The Counter lotrimin cream twice daily in the mean time.

## 2019-09-29 ENCOUNTER — Ambulatory Visit (INDEPENDENT_AMBULATORY_CARE_PROVIDER_SITE_OTHER): Payer: 59 | Admitting: Family Medicine

## 2019-09-29 ENCOUNTER — Encounter: Payer: Self-pay | Admitting: Family Medicine

## 2019-09-29 ENCOUNTER — Other Ambulatory Visit: Payer: Self-pay

## 2019-09-29 VITALS — BP 120/62 | HR 78 | Temp 98.0°F | Wt 111.2 lb

## 2019-09-29 DIAGNOSIS — L989 Disorder of the skin and subcutaneous tissue, unspecified: Secondary | ICD-10-CM | POA: Diagnosis not present

## 2019-09-29 DIAGNOSIS — R634 Abnormal weight loss: Secondary | ICD-10-CM

## 2019-09-29 LAB — T3, FREE: T3, Free: 2.6 pg/mL (ref 2.3–4.2)

## 2019-09-29 LAB — CBC WITH DIFFERENTIAL/PLATELET
Basophils Absolute: 0 10*3/uL (ref 0.0–0.1)
Basophils Relative: 0.7 % (ref 0.0–3.0)
Eosinophils Absolute: 0.2 10*3/uL (ref 0.0–0.7)
Eosinophils Relative: 4.1 % (ref 0.0–5.0)
HCT: 39.4 % (ref 36.0–46.0)
Hemoglobin: 13.1 g/dL (ref 12.0–15.0)
Lymphocytes Relative: 27.3 % (ref 12.0–46.0)
Lymphs Abs: 1.5 10*3/uL (ref 0.7–4.0)
MCHC: 33.3 g/dL (ref 30.0–36.0)
MCV: 98.3 fl (ref 78.0–100.0)
Monocytes Absolute: 0.5 10*3/uL (ref 0.1–1.0)
Monocytes Relative: 9.9 % (ref 3.0–12.0)
Neutro Abs: 3.1 10*3/uL (ref 1.4–7.7)
Neutrophils Relative %: 58 % (ref 43.0–77.0)
Platelets: 259 10*3/uL (ref 150.0–400.0)
RBC: 4.01 Mil/uL (ref 3.87–5.11)
RDW: 14 % (ref 11.5–14.6)
WBC: 5.4 10*3/uL (ref 4.5–10.5)

## 2019-09-29 LAB — HEPATIC FUNCTION PANEL
ALT: 13 U/L (ref 0–35)
AST: 13 U/L (ref 0–37)
Albumin: 4.3 g/dL (ref 3.5–5.2)
Alkaline Phosphatase: 54 U/L (ref 39–117)
Bilirubin, Direct: 0.1 mg/dL (ref 0.0–0.3)
Total Bilirubin: 0.4 mg/dL (ref 0.2–1.2)
Total Protein: 6.7 g/dL (ref 6.0–8.3)

## 2019-09-29 LAB — BASIC METABOLIC PANEL
BUN: 9 mg/dL (ref 6–23)
CO2: 26 mEq/L (ref 19–32)
Calcium: 9.4 mg/dL (ref 8.4–10.5)
Chloride: 103 mEq/L (ref 96–112)
Creatinine, Ser: 0.87 mg/dL (ref 0.40–1.20)
GFR: 99.75 mL/min (ref >60.00)
Glucose, Bld: 69 mg/dL — ABNORMAL LOW (ref 70–99)
Potassium: 3.9 mEq/L (ref 3.5–5.1)
Sodium: 137 mEq/L (ref 135–145)

## 2019-09-29 LAB — T4, FREE: Free T4: 0.72 ng/dL (ref 0.60–1.60)

## 2019-09-29 LAB — TSH: TSH: 1.31 u[IU]/mL (ref 0.35–5.50)

## 2019-09-29 MED ORDER — TRIAMCINOLONE ACETONIDE 0.1 % EX CREA: 1.0000 "application " | TOPICAL_CREAM | Freq: Three times a day (TID) | CUTANEOUS | 1 refills | Status: DC

## 2019-09-29 NOTE — Progress Notes (Signed)
Patient is scheduled with Dr. Sarajane Jews this morning at 10:30 AM

## 2019-09-29 NOTE — Progress Notes (Signed)
   Subjective:    Patient ID: Janice Sutton, female    DOB: 04/24/2001, 18 y.o.   MRN: [id number]  HPI Here for several issues . First an itchy patch of skin appeared on the left lateral abdomen about 4 months ago. It slightly enlarged since then but for the most part it has remained unchanged. She has tried OTC cortisone creams and Lamisil ointment, with no improvement. Now she thinks 2 similar but smaller spots have appeared on her back. The other concern is unintentional weight loss. Her weight had been steady in the 120s for years, until she lost about 10 lbs in the past 3-4 weeks. She says here appetite is good and her diet has not changed. She feels fine physically. She has been drinking 1-2 Ensure shakes daily for the past week but she cannot gain any weight back. She is on no medications. No family hx of thyroid issues. Of note she was diagnosed as pregnant on 08-20-19 by her GYN, Dr. Sumner Boast. She decided to terminate the pregnancy, so she had a surgical abortion on 09-16-19. Since then she has had some low level vaginal bleeding, but she denies any fever or pelvic cramps. She was told by Dr. Talbert Nan that she may have minor vaginal bleeding for up to 6 weeks after the procedure.    Review of Systems  Constitutional: Positive for unexpected weight change. Negative for appetite change, chills, diaphoresis and fatigue.  Respiratory: Negative.   Cardiovascular: Negative.   Gastrointestinal: Negative.   Endocrine: Negative.   Genitourinary: Positive for vaginal bleeding. Negative for dysuria, flank pain, hematuria and pelvic pain.  Skin: Positive for rash.  Neurological: Negative.        Objective:   Physical Exam Constitutional:      Appearance: Normal appearance. She is not ill-appearing.  Neck:     Comments: Her thyroid is normal on exam  Cardiovascular:     Rate and Rhythm: Normal rate and regular rhythm.     Pulses: Normal pulses.     Heart sounds: Normal heart sounds.    Pulmonary:     Effort: Pulmonary effort is normal.     Breath sounds: Normal breath sounds.  Skin:    Comments: There is an annular red lesion on the left lateral flank that is about 3 cm in diameter. The border is raised and the central part is macular and pink. No scaling. No tenderness. There are 2 similar but much smaller lesions on the lower back  Neurological:     Mental Status: She is alert.           Assessment & Plan:  The skin lesions are consistent with eczema, so we will treat with Triamcinolone 1% cream TID. As for the weight loss, we will get labs today including a full thyroid panel to evaluate.  Alysia Penna, MD

## 2019-10-12 ENCOUNTER — Telehealth: Payer: Self-pay

## 2019-10-12 NOTE — Progress Notes (Deleted)
Opened in error

## 2019-10-12 NOTE — Telephone Encounter (Signed)
Pt has been scheduled.  °

## 2019-10-12 NOTE — Telephone Encounter (Signed)
Copied from CRM [id number]. Topic: General - Other >> Oct 12, 2019  2:54 PM Herby Abraham C wrote: Reason for CRM: pt called to schedule a post procedure follow up with PCP. Pt says that she was told to follow up this week, not showing any openings. Please assist.    CB: [phone]

## 2019-10-13 ENCOUNTER — Other Ambulatory Visit: Payer: Self-pay

## 2019-10-14 ENCOUNTER — Encounter: Payer: Self-pay | Admitting: Obstetrics and Gynecology

## 2019-10-14 ENCOUNTER — Ambulatory Visit (INDEPENDENT_AMBULATORY_CARE_PROVIDER_SITE_OTHER): Payer: 59 | Admitting: Obstetrics and Gynecology

## 2019-10-14 VITALS — BP 118/62 | HR 95 | Temp 98.7°F | Ht 62.0 in | Wt 111.0 lb

## 2019-10-14 DIAGNOSIS — N760 Acute vaginitis: Secondary | ICD-10-CM | POA: Diagnosis not present

## 2019-10-14 DIAGNOSIS — Z3009 Encounter for other general counseling and advice on contraception: Secondary | ICD-10-CM | POA: Diagnosis not present

## 2019-10-14 DIAGNOSIS — Z113 Encounter for screening for infections with a predominantly sexual mode of transmission: Secondary | ICD-10-CM | POA: Diagnosis not present

## 2019-10-14 DIAGNOSIS — B9689 Other specified bacterial agents as the cause of diseases classified elsewhere: Secondary | ICD-10-CM

## 2019-10-14 MED ORDER — METRONIDAZOLE 500 MG PO TABS: 500.0000 mg | ORAL_TABLET | Freq: Two times a day (BID) | ORAL | 0 refills | Status: DC

## 2019-10-14 NOTE — Patient Instructions (Signed)
Vaginitis Vaginitis is a condition in which the vaginal tissue swells and becomes red (inflamed). This condition is most often caused by a change in the normal balance of bacteria and yeast that live in the vagina. This change causes an overgrowth of certain bacteria or yeast, which causes the inflammation. There are different types of vaginitis, but the most common types are:  Bacterial vaginosis.  Yeast infection (candidiasis).  Trichomoniasis vaginitis. This is a sexually transmitted disease (STD).  Viral vaginitis.  Atrophic vaginitis.  Allergic vaginitis. What are the causes? The cause of this condition depends on the type of vaginitis. It can be caused by:  Bacteria (bacterial vaginosis).  Yeast, which is a fungus (yeast infection).  A parasite (trichomoniasis vaginitis).  A virus (viral vaginitis).  Low hormone levels (atrophic vaginitis). Low hormone levels can occur during pregnancy, breastfeeding, or after menopause.  Irritants, such as bubble baths, scented tampons, and feminine sprays (allergic vaginitis). Other factors can change the normal balance of the yeast and bacteria that live in the vagina. These include:  Antibiotic medicines.  Poor hygiene.  Diaphragms, vaginal sponges, spermicides, birth control pills, and intrauterine devices (IUD).  Sex.  Infection.  Uncontrolled diabetes.  A weakened defense (immune) system. What increases the risk? This condition is more likely to develop in women who:  Smoke.  Use vaginal douches, scented tampons, or scented sanitary pads.  Wear tight-fitting pants.  Wear thong underwear.  Use oral birth control pills or an IUD.  Have sex without a condom.  Have multiple sex partners.  Have an STD.  Frequently use the spermicide nonoxynol-9.  Eat lots of foods high in sugar.  Have uncontrolled diabetes.  Have low estrogen levels.  Have a weakened immune system from an immune disorder or medical  treatment.  Are pregnant or breastfeeding. What are the signs or symptoms? Symptoms vary depending on the cause of the vaginitis. Common symptoms include:  Abnormal vaginal discharge. ? The discharge is white, gray, or yellow with bacterial vaginosis. ? The discharge is thick, white, and cheesy with a yeast infection. ? The discharge is frothy and yellow or greenish with trichomoniasis.  A bad vaginal smell. The smell is fishy with bacterial vaginosis.  Vaginal itching, pain, or swelling.  Sex that is painful.  Pain or burning when urinating. Sometimes there are no symptoms. How is this diagnosed? This condition is diagnosed based on your symptoms and medical history. A physical exam, including a pelvic exam, will also be done. You may also have other tests, including:  Tests to determine the pH level (acidity or alkalinity) of your vagina.  A whiff test, to assess the odor that results when a sample of your vaginal discharge is mixed with a potassium hydroxide solution.  Tests of vaginal fluid. A sample will be examined under a microscope. How is this treated? Treatment varies depending on the type of vaginitis you have. Your treatment may include:  Antibiotic creams or pills to treat bacterial vaginosis and trichomoniasis.  Antifungal medicines, such as vaginal creams or suppositories, to treat a yeast infection.  Medicine to ease discomfort if you have viral vaginitis. Your sexual partner should also be treated.  Estrogen delivered in a cream, pill, suppository, or vaginal ring to treat atrophic vaginitis. If vaginal dryness occurs, lubricants and moisturizing creams may help. You may need to avoid scented soaps, sprays, or douches.  Stopping use of a product that is causing allergic vaginitis. Then using a vaginal cream to treat the symptoms. Follow   these instructions at home: Lifestyle  Keep your genital area clean and dry. Avoid soap, and only rinse the area with  water.  Do not douche or use tampons until your health care provider says it is okay to do so. Use sanitary pads, if needed.  Do not have sex until your health care provider approves. When you can return to sex, practice safe sex and use condoms.  Wipe from front to back. This avoids the spread of bacteria from the rectum to the vagina. General instructions  Take over-the-counter and prescription medicines only as told by your health care provider.  If you were prescribed an antibiotic medicine, take or use it as told by your health care provider. Do not stop taking or using the antibiotic even if you start to feel better.  Keep all follow-up visits as told by your health care provider. This is important. How is this prevented?  Use mild, non-scented products. Do not use things that can irritate the vagina, such as fabric softeners. Avoid the following products if they are scented: ? Feminine sprays. ? Detergents. ? Tampons. ? Feminine hygiene products. ? Soaps or bubble baths.  Let air reach your genital area. ? Wear cotton underwear to reduce moisture buildup. ? Avoid wearing underwear while you sleep. ? Avoid wearing tight pants and underwear or nylons without a cotton panel. ? Avoid wearing thong underwear.  Take off any wet clothing, such as bathing suits, as soon as possible.  Practice safe sex and use condoms. Contact a health care provider if:  You have abdominal pain.  You have a fever.  You have symptoms that last for more than 2-3 days. Get help right away if:  You have a fever and your symptoms suddenly get worse. Summary  Vaginitis is a condition in which the vaginal tissue becomes inflamed.This condition is most often caused by a change in the normal balance of bacteria and yeast that live in the vagina.  Treatment varies depending on the type of vaginitis you have.  Do not douche, use tampons , or have sex until your health care provider approves. When  you can return to sex, practice safe sex and use condoms. This information is not intended to replace advice given to you by your health care provider. Make sure you discuss any questions you have with your health care provider. Document Revised: 09/05/2017 Document Reviewed: 10/29/2016 Elsevier Patient Education  2020 Elsevier Inc.  

## 2019-10-14 NOTE — Progress Notes (Signed)
GYNECOLOGY  VISIT   HPI: 19 y.o.   Single Black or African American Not Hispanic or Latino  female   G1P0010 with Patient's last menstrual period was 10/04/2019.   here for  Recheck of BV she states that she did not pick up the medication because it was too expensive so she feels like she still has bv.  She states that she has vaginal odor and an increase in brown vaginal d/c.  She had an EAB last month, not sexually active since. Bleed 12/10-12/28.  Last depo provera was a long time again, she is considering restarting depo-provera. She has 3 months worth of OCP's that she hasn't started yer. .  Negative STD testing in 11/20, she would like to be retested. She has been sexually active since then.   GYNECOLOGIC HISTORY: Patient's last menstrual period was 10/04/2019. Contraception:no  Menopausal hormone therapy: none        OB History    Gravida  1   Para  0   Term  0   Preterm  0   AB  0   Living  0     SAB  0   TAB  0   Ectopic  0   Multiple  0   Live Births                 Patient Active Problem List   Diagnosis Date Noted  . Depo-Provera contraceptive status 01/18/2019  . History of chlamydia 01/18/2019  . Dysmenorrhea 07/24/2014  . Irregular menstrual cycle 07/24/2014  . History of ADHD 04/16/2013  . ADHD (attention deficit hyperactivity disorder) 04/16/2013    Past Medical History:  Diagnosis Date  . ADD (attention deficit disorder)   . STD (sexually transmitted disease) 07/2016   Chlamydia     No past surgical history on file.  No current outpatient medications on file.   No current facility-administered medications for this visit.     ALLERGIES: Patient has no known allergies.  Family History  Problem Relation Age of Onset  . Asthma Mother     Social History   Socioeconomic History  . Marital status: Single    Spouse name: Not on file  . Number of children: Not on file  . Years of education: Not on file  . Highest education  level: Not on file  Occupational History  . Not on file  Tobacco Use  . Smoking status: Former Smoker    Quit date: 08/20/2019    Years since quitting: 0.1  . Smokeless tobacco: Never Used  Substance and Sexual Activity  . Alcohol use: No  . Drug use: No  . Sexual activity: Yes    Partners: Male    Birth control/protection: None  Other Topics Concern  . Not on file  Social History Narrative   2 people living in the home. Mother and teen    pet yorkipoo   Moved from [place]   Father high school incarcerated    mother  [place] Sova ;history of asthma; MBA accounting and finance   Negative ETS /FA   Social Determinants of Health   Financial Resource Strain:   . Difficulty of Paying Living Expenses: Not on file  Food Insecurity:   . Worried About Programme researcher, broadcasting/film/video in the Last Year: Not on file  . Ran Out of Food in the Last Year: Not on file  Transportation Needs:   . Lack of Transportation (Medical): Not on file  . Lack of  Transportation (Non-Medical): Not on file  Physical Activity:   . Days of Exercise per Week: Not on file  . Minutes of Exercise per Session: Not on file  Stress:   . Feeling of Stress : Not on file  Social Connections:   . Frequency of Communication with Friends and Family: Not on file  . Frequency of Social Gatherings with Friends and Family: Not on file  . Attends Religious Services: Not on file  . Active Member of Clubs or Organizations: Not on file  . Attends Archivist Meetings: Not on file  . Marital Status: Not on file  Intimate Partner Violence:   . Fear of Current or Ex-Partner: Not on file  . Emotionally Abused: Not on file  . Physically Abused: Not on file  . Sexually Abused: Not on file    Review of Systems  Genitourinary:       Vaginal Odor   All other systems reviewed and are negative.   PHYSICAL EXAMINATION:    BP 118/62   Pulse 95   Temp 98.7 F (37.1 C)   Ht 5\' 2"  (1.575 m)   Wt 111 lb (50.3  kg)   LMP 10/04/2019   SpO2 98%   BMI 20.30 kg/m     General appearance: alert, cooperative and appears stated age  Pelvic: External genitalia:  no lesions              Urethra:  normal appearing urethra with no masses, tenderness or lesions              Bartholins and Skenes: normal                 Vagina: normal appearing vagina with a slight increase in watery, brown vaginal discharge              Cervix: no lesions                Chaperone was present for exam.  Wet prep: ++ clue, no trich, few wbc KOH: no yeast PH: 5   ASSESSMENT BV Contraception S/p EAB last month Screening STD   PLAN Flagyl for BV (no ETOH) Will start OCP's today, may have AUB the first month Considering depo-provera again Screening STD    An After Visit Summary was printed and given to the patient.

## 2019-10-15 ENCOUNTER — Ambulatory Visit: Payer: 59 | Admitting: Internal Medicine

## 2019-10-15 LAB — HIV ANTIBODY (ROUTINE TESTING W REFLEX): HIV Screen 4th Generation wRfx: NONREACTIVE

## 2019-10-15 LAB — RPR: RPR Ser Ql: NONREACTIVE

## 2019-10-16 LAB — CHLAMYDIA/GONOCOCCUS/TRICHOMONAS, NAA
Chlamydia by NAA: NEGATIVE
Gonococcus by NAA: NEGATIVE
Trich vag by NAA: NEGATIVE

## 2019-12-01 NOTE — Progress Notes (Signed)
Virtual Visit via Video Note  I connected with@ on 2 25 21  at 11:00 AM EST by a video enabled telemedicine application and verified that I am speaking with the correct person using two identifiers. Location patient: home Location provider:work office Persons participating in the virtual visit: patient, provider  W[place]th national recommendations  regarding COVID 19 pandemic   video visit is advised over in office visit for this patient.  Patient aware  of the limitations of evaluation and management by telemedicine and  availability of in person appointments. and agreed to proceed.   HPI: Mikenna Bunkley presents for video visit  For on going  Itchy rash  Poss eczema   Has been seen by 2 other provider in our practice over the months and  Given topical steroid which helps but never goes away  And has changed laundry detergent   Area under left breast and then left flank area  Very itcy and tyring not to scratch     Ws told consider  Dermatology if   persistent or progressive   No fever  Denies conttact derm  Not sure what could be   No pain .   ROS: See pertinent positives and negatives per HPI.  Past Medical History:  Diagnosis Date  . ADD (attention deficit disorder)   . STD (sexually transmitted disease) 07/2016   Chlamydia     No past surgical history on file.  Family History  Problem Relation Age of Onset  . Asthma Mother     Social History   Tobacco Use  . Smoking status: Former Smoker    Quit date: 08/20/2019    Years since quitting: 0.2  . Smokeless tobacco: Never Used  Substance Use Topics  . Alcohol use: No  . Drug use: No      Current Outpatient Medications:  .  metroNIDAZOLE (FLAGYL) 500 MG tablet, Take 1 tablet (500 mg total) by mouth 2 (two) times daily., Disp: 14 tablet, Rfl: 0  EXAM: BP Readings from Last 3 Encounters:  10/14/19 118/62  09/29/19 120/62  08/20/19 106/64    VITALS per patient if applicable:  GENERAL: alert, oriented, appears well  and in no acute distress  HEENT: atraumatic, conjunttiva clear, no obvious abnormalities on inspection of external nose and ears  NECK: normal movements of the head and neck  LUNGS: on inspection no signs of respiratory distress, breathing rate appears normal, no obvious gross SOB, gasping or wheezing  CV: no obvious cyanosis Skin left inframammary  intertriginous area are  Pin pigmented rash  And  Discrete bumps ? If flat on left  Flank    MS: moves all visible extremities without noticeable abnormality  PSYCH/NEURO: pleasant and cooperative, no obvious depression or anxiety, speech and thought processing grossly intact   ASSESSMENT AND PLAN:  Discussed the following assessment and plan:    ICD-10-CM   1. Dermatitis itchy   L30.9 Ambulatory referral to Dermatology   considier atypical eczema,  poss addit yeast intertrigo under left breast partly steroid responsive  consdier contact also  derm consult   reviewed record   And given  tms and other in past  Add  Anti yeast topical for inframammary area  And  Then plan  Derm consult    In liew of in person visit since not sure would advise other different  Intervention .  Consider atypical contact other ?  Cont  Sterid and add  Anti yeast med  Counseled.   Expectant management and discussion  of plan and treatment with opportunity to ask questions and all were answered. The patient agreed with the plan and demonstrated an understanding of the instructions.   Advised to call back or seek an in-person evaluation if worsening  or having  further concerns . Return if symptoms worsen   in interim.r.   Shanon Ace, MD

## 2019-12-02 ENCOUNTER — Telehealth (INDEPENDENT_AMBULATORY_CARE_PROVIDER_SITE_OTHER): Payer: 59 | Admitting: Internal Medicine

## 2019-12-02 ENCOUNTER — Other Ambulatory Visit: Payer: Self-pay

## 2019-12-02 ENCOUNTER — Encounter: Payer: Self-pay | Admitting: Internal Medicine

## 2019-12-02 ENCOUNTER — Telehealth: Payer: 59 | Admitting: Internal Medicine

## 2019-12-02 DIAGNOSIS — L309 Dermatitis, unspecified: Secondary | ICD-10-CM

## 2019-12-20 ENCOUNTER — Encounter: Payer: Self-pay | Admitting: Certified Nurse Midwife

## 2019-12-22 ENCOUNTER — Encounter: Payer: Self-pay | Admitting: Certified Nurse Midwife

## 2020-01-11 ENCOUNTER — Encounter: Payer: Self-pay | Admitting: Family Medicine

## 2020-01-11 ENCOUNTER — Ambulatory Visit (INDEPENDENT_AMBULATORY_CARE_PROVIDER_SITE_OTHER): Payer: 59 | Admitting: Family Medicine

## 2020-01-11 ENCOUNTER — Other Ambulatory Visit: Payer: Self-pay

## 2020-01-11 VITALS — BP 118/62 | HR 76 | Temp 98.5°F | Wt 123.8 lb

## 2020-01-11 DIAGNOSIS — M25541 Pain in joints of right hand: Secondary | ICD-10-CM | POA: Diagnosis not present

## 2020-01-11 DIAGNOSIS — R21 Rash and other nonspecific skin eruption: Secondary | ICD-10-CM | POA: Diagnosis not present

## 2020-01-11 LAB — SEDIMENTATION RATE: Sed Rate: 7 mm/hr (ref 0–20)

## 2020-01-11 LAB — CBC WITH DIFFERENTIAL/PLATELET
Basophils Absolute: 0.1 10*3/uL (ref 0.0–0.1)
Basophils Relative: 1 % (ref 0.0–3.0)
Eosinophils Absolute: 0.1 10*3/uL (ref 0.0–0.7)
Eosinophils Relative: 1.4 % (ref 0.0–5.0)
HCT: 42.1 % (ref 36.0–46.0)
Hemoglobin: 14.1 g/dL (ref 12.0–15.0)
Lymphocytes Relative: 27.6 % (ref 12.0–46.0)
Lymphs Abs: 1.5 10*3/uL (ref 0.7–4.0)
MCHC: 33.5 g/dL (ref 30.0–36.0)
MCV: 98.6 fl (ref 78.0–100.0)
Monocytes Absolute: 0.5 10*3/uL (ref 0.1–1.0)
Monocytes Relative: 9 % (ref 3.0–12.0)
Neutro Abs: 3.4 10*3/uL (ref 1.4–7.7)
Neutrophils Relative %: 61 % (ref 43.0–77.0)
Platelets: 247 10*3/uL (ref 150.0–400.0)
RBC: 4.26 Mil/uL (ref 3.87–5.11)
RDW: 12.9 % (ref 11.5–14.6)
WBC: 5.6 10*3/uL (ref 4.5–10.5)

## 2020-01-11 LAB — C-REACTIVE PROTEIN: CRP: <1 mg/dL (ref 0.5–20.0)

## 2020-01-11 MED ORDER — METHYLPREDNISOLONE 4 MG PO TBPK: ORAL_TABLET | ORAL | 0 refills | Status: DC

## 2020-01-11 NOTE — Progress Notes (Signed)
   Subjective:    Patient ID: Janice Sutton, female    DOB: 2001-01-29, 19 y.o.   MRN: [id number]  HPI Here to follow up on rashes and also to look at a new problem that she woke up with this morning. Today she had the sudden onset of swelling, redness, warmth, and pain in the right hand. She has never had this happen before. No recent trauma. No insect stings or bites. The rash on her trunk is still present and it itches constantly. She saw Lennie Odor NP at [facility] recently and she gave her Betamethasone cream to ry along with Xyzal. This helps a little but the rash persists. No fevers. She had seen [place] for unexplained weight loss several months ago, but she has gained all this back to her baseline in the mid 120's. Of note, one grandmother has rheumatoid arthritis.    Review of Systems  Constitutional: Negative.   Respiratory: Negative.   Cardiovascular: Negative.   Gastrointestinal: Negative.   Genitourinary: Negative.   Musculoskeletal: Positive for arthralgias and joint swelling.  Skin: Positive for rash.  Neurological: Negative.        Objective:   Physical Exam Constitutional:      Appearance: Normal appearance. She is not ill-appearing.  Cardiovascular:     Rate and Rhythm: Normal rate and regular rhythm.     Pulses: Normal pulses.     Heart sounds: Normal heart sounds.  Pulmonary:     Effort: Pulmonary effort is normal.     Breath sounds: Normal breath sounds.  Musculoskeletal:     Comments: The dorsal right hand is red, warm, and swollen. She is quite tender over all the MCP joints and the thumb. No puncture wounds or bite marks are visible   Skin:    Comments: There are wide spread maculopapular patches of erythematous rash on the back and both flanks.   Neurological:     Mental Status: She is alert.           Assessment & Plan:  She has had an itchy rash for several months, and now she has the acute onset of pain and swelling in the right hand.  Possible etiologies that could tie all this together would be inflammatory arthritis or a dermatomyositis. We will give her a Medrol dose pack to help with symptom relief. Get labs today to include CBC, ESR, CRP, RF, DS DNA, etc.  Alysia Penna, MD

## 2020-01-12 LAB — ANA: Anti Nuclear Antibody (ANA): NEGATIVE

## 2020-01-12 LAB — ANTI-DNA ANTIBODY, DOUBLE-STRANDED: ds DNA Ab: 1 IU/mL

## 2020-01-12 LAB — RHEUMATOID FACTOR: Rheumatoid fact SerPl-aCnc: <14 IU/mL (ref <14)

## 2020-04-11 ENCOUNTER — Other Ambulatory Visit: Payer: Self-pay

## 2020-04-11 ENCOUNTER — Encounter: Payer: Self-pay | Admitting: Obstetrics and Gynecology

## 2020-04-11 ENCOUNTER — Ambulatory Visit (INDEPENDENT_AMBULATORY_CARE_PROVIDER_SITE_OTHER): Payer: 59 | Admitting: Obstetrics and Gynecology

## 2020-04-11 ENCOUNTER — Telehealth: Payer: Self-pay | Admitting: Obstetrics and Gynecology

## 2020-04-11 VITALS — BP 90/62 | HR 75 | Temp 98.4°F | Ht 62.0 in | Wt 122.0 lb

## 2020-04-11 DIAGNOSIS — Z113 Encounter for screening for infections with a predominantly sexual mode of transmission: Secondary | ICD-10-CM | POA: Diagnosis not present

## 2020-04-11 DIAGNOSIS — Z3009 Encounter for other general counseling and advice on contraception: Secondary | ICD-10-CM | POA: Diagnosis not present

## 2020-04-11 DIAGNOSIS — N760 Acute vaginitis: Secondary | ICD-10-CM | POA: Diagnosis not present

## 2020-04-11 MED ORDER — NORETHIN ACE-ETH ESTRAD-FE 1-20 MG-MCG PO TABS: 1.0000 | ORAL_TABLET | Freq: Every day | ORAL | 0 refills | Status: DC

## 2020-04-11 MED ORDER — METRONIDAZOLE 0.75 % VA GEL: VAGINAL | 3 refills | Status: DC

## 2020-04-11 NOTE — Progress Notes (Signed)
GYNECOLOGY  VISIT   HPI: 19 y.o.   Single Black or African American Not Hispanic or Latino  female   G1P0010 with Patient's last menstrual period was 03/17/2020.   here for vaginal odor she just noticed it this morning.  Last night she noticed a slight increase in white creamy vaginal d/c. Mild odor starting today. No itching, burning or irritation.    GYNECOLOGIC HISTORY: Patient's last menstrual period was 03/17/2020. Contraception: none  Menopausal hormone therapy: none         OB History    Gravida  1   Para  0   Term  0   Preterm  0   AB  0   Living  0     SAB  0   TAB  0   Ectopic  0   Multiple  0   Live Births                 Patient Active Problem List   Diagnosis Date Noted  . Depo-Provera contraceptive status 01/18/2019  . History of chlamydia 01/18/2019  . Dysmenorrhea 07/24/2014  . Irregular menstrual cycle 07/24/2014  . History of ADHD 04/16/2013  . ADHD (attention deficit hyperactivity disorder) 04/16/2013    Past Medical History:  Diagnosis Date  . ADD (attention deficit disorder)   . STD (sexually transmitted disease) 07/2016   Chlamydia     No past surgical history on file.  Current Outpatient Medications  Medication Sig Dispense Refill  . betamethasone dipropionate 0.05 % cream APPLY EXTERNALLY TO THE AFFECTED AREA TWICE DAILY THEN AS NEEDED FLARE     No current facility-administered medications for this visit.     ALLERGIES: Patient has no known allergies.  Family History  Problem Relation Age of Onset  . Asthma Mother     Social History   Socioeconomic History  . Marital status: Single    Spouse name: Not on file  . Number of children: Not on file  . Years of education: Not on file  . Highest education level: Not on file  Occupational History  . Not on file  Tobacco Use  . Smoking status: Former Smoker    Quit date: 08/20/2019    Years since quitting: 0.6  . Smokeless tobacco: Never Used  Vaping Use  .  Vaping Use: Never used  Substance and Sexual Activity  . Alcohol use: No  . Drug use: No  . Sexual activity: Yes    Partners: Male    Birth control/protection: None  Other Topics Concern  . Not on file  Social History Narrative   2 people living in the home. Mother and teen    pet yorkipoo   Moved from [place]   Father high school incarcerated    mother  Jazlene Bares ;history of asthma; MBA accounting and finance   Negative ETS /FA   Social Determinants of Health   Financial Resource Strain:   . Difficulty of Paying Living Expenses:   Food Insecurity:   . Worried About Programme researcher, broadcasting/film/video in the Last Year:   . Barista in the Last Year:   Transportation Needs:   . Freight forwarder (Medical):   Marland Kitchen Lack of Transportation (Non-Medical):   Physical Activity:   . Days of Exercise per Week:   . Minutes of Exercise per Session:   Stress:   . Feeling of Stress :   Social Connections:   . Frequency of Communication with Friends  and Family:   . Frequency of Social Gatherings with Friends and Family:   . Attends Religious Services:   . Active Member of Clubs or Organizations:   . Attends Banker Meetings:   Marland Kitchen Marital Status:   Intimate Partner Violence:   . Fear of Current or Ex-Partner:   . Emotionally Abused:   Marland Kitchen Physically Abused:   . Sexually Abused:     Review of Systems  Genitourinary:       Vaginal odor  All other systems reviewed and are negative.   PHYSICAL EXAMINATION:    BP 90/62   Pulse 75   Temp 98.4 F (36.9 C)   Ht 5\' 2"  (1.575 m)   Wt 122 lb (55.3 kg)   LMP 03/17/2020   SpO2 99%   BMI 22.31 kg/m     General appearance: alert, cooperative and appears stated age  Pelvic: External genitalia:  no lesions              Urethra:  normal appearing urethra with no masses, tenderness or lesions              Bartholins and Skenes: normal                 Vagina: normal appearing vagina with a slight increase in  thin, watery, frothy, white vaginal discharge              Cervix: no lesions  Chaperone was present for exam.  Wet prep: ++ clue, no trich, + wbc KOH: no yeast PH: 5   ASSESSMENT BV, this is her 3rd episode in a year.  Desires full STD testing Desires contraception, considering depo-provera, interested in the pill for now    PLAN Treat with metrogel for one week, then 2 x a week for 6 months Start OCP's, no contraindications, risks reviewed F/U for annual exam and pill check in 3 months Cervical cultures sent Return for full STD blood work (lab closed)

## 2020-04-11 NOTE — Telephone Encounter (Signed)
Spoke with pt. Pt states having vaginal sx of discharge, odor and itching x 1 day. Pt states would like to have STD testing as well. Pt is SA and no contraception and doesn't use condoms. Pt denies any known exposure to STDs.   LMP 6/08-12-14. AEX 06/01/20  Advised pt to be seen for further evaluation. Pt agreeable. Pt scheduled with Dr Oscar La today 7/6 at 4:30 pm. Pt agreeable and verbalized understanding to date and time of appt CPS neg.   Routing to Dr Oscar La for review  Encounter closed.

## 2020-04-11 NOTE — Patient Instructions (Signed)

## 2020-04-11 NOTE — Telephone Encounter (Signed)
Patient wants to come in for STD testing.

## 2020-04-13 ENCOUNTER — Other Ambulatory Visit: Payer: Self-pay

## 2020-04-13 ENCOUNTER — Other Ambulatory Visit (INDEPENDENT_AMBULATORY_CARE_PROVIDER_SITE_OTHER): Payer: 59

## 2020-04-13 DIAGNOSIS — Z113 Encounter for screening for infections with a predominantly sexual mode of transmission: Secondary | ICD-10-CM

## 2020-04-14 LAB — HSV(HERPES SIMPLEX VRS) I + II AB-IGG
HSV 1 Glycoprotein G Ab, IgG: 58.1 index — ABNORMAL HIGH (ref 0.00–0.90)
HSV 2 IgG, Type Spec: <0.91 index (ref 0.00–0.90)

## 2020-04-14 LAB — HEP, RPR, HIV PANEL
HIV Screen 4th Generation wRfx: NONREACTIVE
Hepatitis B Surface Ag: NEGATIVE
RPR Ser Ql: NONREACTIVE

## 2020-04-14 LAB — CHLAMYDIA/GONOCOCCUS/TRICHOMONAS, NAA
Chlamydia by NAA: NEGATIVE
Gonococcus by NAA: NEGATIVE
Trich vag by NAA: NEGATIVE

## 2020-04-14 LAB — HEPATITIS C ANTIBODY: Hep C Virus Ab: <0.1 s/co ratio (ref 0.0–0.9)

## 2020-04-20 ENCOUNTER — Telehealth: Payer: Self-pay | Admitting: *Deleted

## 2020-04-20 NOTE — Telephone Encounter (Signed)
-----   Message from Romualdo Bolk, MD sent at 04/19/2020  7:11 PM EDT ----- Please let the patient know that her HSV 1 serology was +, HSV 2 was negative. Type one is more commonly associated with oral HSV and type 2 with genital HSV, but either type can be in either area. See if she wants to come in to further discuss.

## 2020-04-20 NOTE — Telephone Encounter (Signed)
Leda Min, RN  04/20/2020 4:26 PM EDT Back to Top    Call to patient, no answer, voicemail not set up, unable to leave message. No alternative number on file.

## 2020-04-24 ENCOUNTER — Telehealth: Payer: Self-pay | Admitting: Obstetrics and Gynecology

## 2020-04-24 NOTE — Telephone Encounter (Signed)
Charlyn Minerva   GW  04/24/20 2:48 PM Note Patient has a vaginal boil that may need draining.     Spoke with patient.   1. Advised of results as seen below per Dr. Oscar La. Patient reports no Hx of outbreaks, would like to further discuss with provider.   2. Reports a "boil" on vulva that has been present for over 1 wk. No longer painful. Not reducing in size or draining. Patient is requesting an OV with provider to discuss possibly draining it. Denies fever/chills, redness, warmth or swelling. Patient is aware Dr. Oscar La is out of the office this week, request to schedule with covering provider. OV scheduled for 7/20 at 11am with Dr. Hyacinth Meeker. Recommended warm compresses or baths, do not squeeze "boil". Patient verbalizes understanding and is agreeable.   Routing to provider for final review. Patient is agreeable to disposition. Will close encounter.

## 2020-04-24 NOTE — Telephone Encounter (Signed)
See open encounter dated 04/20/20.  ° °Encounter closed.  °

## 2020-04-24 NOTE — Telephone Encounter (Signed)
Patient has a vaginal boil that may need draining.

## 2020-04-25 ENCOUNTER — Telehealth: Payer: Self-pay | Admitting: *Deleted

## 2020-04-25 ENCOUNTER — Ambulatory Visit: Payer: 59 | Admitting: Obstetrics & Gynecology

## 2020-04-25 ENCOUNTER — Telehealth: Payer: Self-pay

## 2020-04-25 NOTE — Telephone Encounter (Signed)
Spoke with patient.  Patient reports she has been using warm compresses, "boil" seems to be resolving, is getting smaller, no new symptoms. Patient is requesting to r/s OV to discuss HSV results. OV scheduled for 7/30 at 11am with Dr. Oscar La. Patient is aware to contact the office if any new symptoms develop.   Routing to covering provider, Dr. Hyacinth Meeker,  for final review. Patient is agreeable to disposition. Will close encounter.

## 2020-04-25 NOTE — Telephone Encounter (Signed)
Patient had appt scheduled for 11am on 04-25-2020. I called patient to let her know that I needed to reschedule her appointment due to Dr Hyacinth Meeker being delayed in surgery. Patient was told we could see her on 04-27-2020 at 1:30. She stated she would call us back to schedule.

## 2020-04-25 NOTE — Telephone Encounter (Signed)
Patient returning call to reschedule appointment that was cancelled because of surgery delay this morning. Patient says she won't be available until next week.

## 2020-05-04 NOTE — Progress Notes (Deleted)
GYNECOLOGY  VISIT   HPI: 19 y.o.   Single Black or African American Not Hispanic or Latino  female   G1P0000 with No LMP recorded. (Menstrual status: Other).   here for     GYNECOLOGIC HISTORY: No LMP recorded. (Menstrual status: Other). Contraception: LOESTRIN FE Menopausal hormone therapy: none        OB History    Gravida  1   Para  0   Term  0   Preterm  0   AB  0   Living  0     SAB  0   TAB  0   Ectopic  0   Multiple  0   Live Births                 Patient Active Problem List   Diagnosis Date Noted  . Depo-Provera contraceptive status 01/18/2019  . History of chlamydia 01/18/2019  . Dysmenorrhea 07/24/2014  . Irregular menstrual cycle 07/24/2014  . History of ADHD 04/16/2013  . ADHD (attention deficit hyperactivity disorder) 04/16/2013    Past Medical History:  Diagnosis Date  . ADD (attention deficit disorder)   . STD (sexually transmitted disease) 07/2016   Chlamydia     No past surgical history on file.  Current Outpatient Medications  Medication Sig Dispense Refill  . betamethasone dipropionate 0.05 % cream APPLY EXTERNALLY TO THE AFFECTED AREA TWICE DAILY THEN AS NEEDED FLARE    . metroNIDAZOLE (METROGEL) 0.75 % vaginal gel Place one applicator vaginally qhs x 1 week, then change to 2 x a week for 6 months 140 g 3  . norethindrone-ethinyl estradiol (LOESTRIN FE) 1-20 MG-MCG tablet Take 1 tablet by mouth daily. 84 tablet 0   No current facility-administered medications for this visit.     ALLERGIES: Patient has no known allergies.  Family History  Problem Relation Age of Onset  . Asthma Mother     Social History   Socioeconomic History  . Marital status: Single    Spouse name: Not on file  . Number of children: Not on file  . Years of education: Not on file  . Highest education level: Not on file  Occupational History  . Not on file  Tobacco Use  . Smoking status: Former Smoker    Quit date: 08/20/2019    Years since  quitting: 0.7  . Smokeless tobacco: Never Used  Vaping Use  . Vaping Use: Never used  Substance and Sexual Activity  . Alcohol use: No  . Drug use: No  . Sexual activity: Yes    Partners: Male    Birth control/protection: None  Other Topics Concern  . Not on file  Social History Narrative   2 people living in the home. Mother and teen    pet yorkipoo   Moved from [place]   Father high school incarcerated    mother  Jakhia Buxton ;history of asthma; MBA accounting and finance   Negative ETS /FA   Social Determinants of Health   Financial Resource Strain:   . Difficulty of Paying Living Expenses:   Food Insecurity:   . Worried About Programme researcher, broadcasting/film/video in the Last Year:   . Barista in the Last Year:   Transportation Needs:   . Freight forwarder (Medical):   Marland Kitchen Lack of Transportation (Non-Medical):   Physical Activity:   . Days of Exercise per Week:   . Minutes of Exercise per Session:   Stress:   .  Feeling of Stress :   Social Connections:   . Frequency of Communication with Friends and Family:   . Frequency of Social Gatherings with Friends and Family:   . Attends Religious Services:   . Active Member of Clubs or Organizations:   . Attends Banker Meetings:   Marland Kitchen Marital Status:   Intimate Partner Violence:   . Fear of Current or Ex-Partner:   . Emotionally Abused:   Marland Kitchen Physically Abused:   . Sexually Abused:     ROS  PHYSICAL EXAMINATION:    There were no vitals taken for this visit.    General appearance: alert, cooperative and appears stated age Neck: no adenopathy, supple, symmetrical, trachea midline and thyroid {CHL AMB PHY EX THYROID NORM DEFAULT:[phone]::"normal to inspection and palpation"} Breasts: {Exam; breast:13139::"normal appearance, no masses or tenderness"} Abdomen: soft, non-tender; non distended, no masses,  no organomegaly  Pelvic: External genitalia:  no lesions              Urethra:  normal  appearing urethra with no masses, tenderness or lesions              Bartholins and Skenes: normal                 Vagina: normal appearing vagina with normal color and discharge, no lesions              Cervix: {CHL AMB PHY EX CERVIX NORM DEFAULT:[phone]::"no lesions"}              Bimanual Exam:  Uterus:  {CHL AMB PHY EX UTERUS NORM DEFAULT:[phone]::"normal size, contour, position, consistency, mobility, non-tender"}              Adnexa: {CHL AMB PHY EX ADNEXA NO MASS DEFAULT:[phone]::"no mass, fullness, tenderness"}              Rectovaginal: {yes no:[id number]}.  Confirms.              Anus:  normal sphincter tone, no lesions  Chaperone was present for exam.  ASSESSMENT     PLAN    An After Visit Summary was printed and given to the patient.  *** minutes face to face time of which over 50% was spent in counseling.

## 2020-05-05 ENCOUNTER — Encounter: Payer: Self-pay | Admitting: Obstetrics and Gynecology

## 2020-05-05 ENCOUNTER — Ambulatory Visit: Payer: Self-pay | Admitting: Obstetrics and Gynecology

## 2020-05-10 ENCOUNTER — Telehealth: Payer: Self-pay

## 2020-05-10 NOTE — Telephone Encounter (Signed)
Spoke with pt. Pt calling to reschedule appt that she missed on 05/05/20 due to not having coverage at work. Pt scheduled now for 8/9 at 9 am with Dr Oscar La. Pt agreeable to date and time of appt.   Encounter closed.

## 2020-05-10 NOTE — Telephone Encounter (Signed)
Patient is calling to reschedule office visit from (05/05/20). Need triage to assist.

## 2020-05-15 ENCOUNTER — Encounter: Payer: Self-pay | Admitting: Obstetrics and Gynecology

## 2020-05-15 ENCOUNTER — Ambulatory Visit: Payer: Self-pay | Admitting: Obstetrics and Gynecology

## 2020-05-15 ENCOUNTER — Ambulatory Visit (INDEPENDENT_AMBULATORY_CARE_PROVIDER_SITE_OTHER): Payer: 59 | Admitting: Obstetrics and Gynecology

## 2020-05-15 ENCOUNTER — Other Ambulatory Visit: Payer: Self-pay

## 2020-05-15 VITALS — BP 90/68 | HR 105 | Ht 62.0 in | Wt 119.0 lb

## 2020-05-15 DIAGNOSIS — B009 Herpesviral infection, unspecified: Secondary | ICD-10-CM | POA: Diagnosis not present

## 2020-05-15 NOTE — Patient Instructions (Signed)
Cold Sore  A cold sore, also called a fever blister, is a small, fluid-filled sore that forms inside the mouth or on the lips, gums, nose, chin, or cheeks. Cold sores can spread to other parts of the body, such as the eyes or fingers. In some people who have other medical conditions, cold sores can spread to multiple other body sites, including the genitals. Cold sores can spread from person to person (are contagious) until the sores crust over completely. Most cold sores go away within 2 weeks. What are the causes? Cold sores are caused by an infection from a common type of herpes simplex virus (HSV-1). HSV-1 is closely related to the HSV-2virus, which is the virus that causes genital herpes, but these viruses are not the same. Once a person is infected with HSV-1, the virus remains permanently in the body. HSV-1 is spread from person to person through close contact, such as through kissing, touching the affected area, or sharing personal items such as lip balm, razors, a drinking glass, or eating utensils. What increases the risk? You are more likely to develop this condition if you:  Are tired, stressed, or sick.  Are menstruating.  Are pregnant.  Take certain medicines.  Are exposed to cold weather or too much sun. What are the signs or symptoms? Symptoms of a cold sore outbreak go through different stages. These are the stages of a cold sore:  Tingling, itching, or burning is felt 1-2 days before the outbreak.  Fluid-filled blisters appear on the lips, inside the mouth, on the nose, or on the cheeks.  The blisters start to ooze clear fluid.  The blisters dry up, and a yellow crust appears in their place.  The crust falls off. In some cases, other symptoms can develop during a cold sore outbreak. These can include:  Fever.  Sore throat.  Headache.  Muscle aches.  Swollen neck glands. How is this diagnosed? This condition is diagnosed based on your medical history and a  physical exam. Your health care provider may do a blood test or may swab some fluid from your sore and then examine the swab in the lab. How is this treated? There is no cure for cold sores or HSV-1. There is also no vaccine for HSV-1. Most cold sores go away on their own without treatment within 2 weeks. Medicines cannot make the infection go away, but your health care provider may prescribe medicines to:  Help relieve some of the pain associated with the sores.  Work to stop the virus from multiplying.  Shorten healing time. Medicines may be in the form of creams, gels, pills, or a shot. Follow these instructions at home: Medicines  Take or apply over-the-counter and prescription medicines only as told by your health care provider.  Use a cotton-tip swab to apply creams or gels to your sores.  Ask your health care provider if you can take lysine supplements. Research has found that lysine may help heal the cold sore faster and prevent outbreaks. Sore care   Do not touch the sores or pick the scabs.  Wash your hands often. Do not touch your eyes without washing your hands first.  Keep the sores clean and dry.  If directed, apply ice to the sores: ? Put ice in a plastic bag. ? Place a towel between your skin and the bag. ? Leave the ice on for 20 minutes, 2-3 times a day. Eating and drinking  Eat a soft, bland diet. Avoid eating   hot, cold, or salty foods.  Use a straw if it hurts to drink out of a glass.  Eat foods that are rich in lysine, such as meat, fish, and dairy products.  Avoid sugary foods, chocolates, nuts, and grains. These foods are rich in a nutrient called arginine, which can cause the virus to multiply. Lifestyle  Do not kiss, have oral sex, or share personal items until your sores heal.  Stress, poor sleep, and being out in the sun can trigger outbreaks. Make sure you: ? Do activities that help you relax, such as deep breathing exercises or  meditation. ? Get enough sleep. ? Apply sunscreen on your lips before you go out in the sun. Contact a health care provider if:  You have symptoms for more than 2 weeks.  You have pus coming from the sores.  You have redness that is spreading.  You have pain or irritation in your eye.  You get sores on your genitals.  Your sores do not heal within 2 weeks.  You have frequent cold sore outbreaks. Get help right away if you have:  A fever and your symptoms suddenly get worse.  A headache and confusion.  Fatigue or loss of appetite.  A stiff neck or sensitivity to light. Summary  A cold sore, also called a fever blister, is a small, fluid-filled sore that forms inside the mouth or on the lips, gums, nose, chin, or cheeks.  Most cold sores go away on their own without treatment within 2 weeks. Your health care provider may prescribe medicines to help relieve some of the pain, work to stop the virus from multiplying, and shorten healing time.  Wash your hands often. Do not touch your eyes without washing your hands first.  Do not kiss, have oral sex, or share personal items until your sores heal.  Contact a health care provider if your sores do not heal within 2 weeks. This information is not intended to replace advice given to you by your health care provider. Make sure you discuss any questions you have with your health care provider. Document Revised: 01/13/2019 Document Reviewed: 02/23/2018 Elsevier Patient Education  2020 [employer]   

## 2020-05-15 NOTE — Progress Notes (Signed)
GYNECOLOGY  VISIT   HPI: 19 y.o.   Single Black or African American Not Hispanic or Latino  female   G1P0000 with Patient's last menstrual period was 04/18/2020.   here to discuss labs from 04/13/20 and for a vulvar bump.  Recent STD testing was + for HSV I serology, otherwise negative.  She had a lump on her right vulva, it was painful, it has gotten much better, not hurting any more, didn't drain.   GYNECOLOGIC HISTORY: Patient's last menstrual period was 04/18/2020. Contraception:loestrin fe  Menopausal hormone therapy: Na         OB History    Gravida  1   Para  0   Term  0   Preterm  0   AB  0   Living  0     SAB  0   TAB  0   Ectopic  0   Multiple  0   Live Births                 Patient Active Problem List   Diagnosis Date Noted  . Depo-Provera contraceptive status 01/18/2019  . History of chlamydia 01/18/2019  . Dysmenorrhea 07/24/2014  . Irregular menstrual cycle 07/24/2014  . History of ADHD 04/16/2013  . ADHD (attention deficit hyperactivity disorder) 04/16/2013    Past Medical History:  Diagnosis Date  . ADD (attention deficit disorder)   . STD (sexually transmitted disease) 07/2016   Chlamydia     History reviewed. No pertinent surgical history.  Current Outpatient Medications  Medication Sig Dispense Refill  . betamethasone dipropionate 0.05 % cream APPLY EXTERNALLY TO THE AFFECTED AREA TWICE DAILY THEN AS NEEDED FLARE    . norethindrone-ethinyl estradiol (LOESTRIN FE) 1-20 MG-MCG tablet Take 1 tablet by mouth daily. 84 tablet 0   No current facility-administered medications for this visit.     ALLERGIES: Patient has no known allergies.  Family History  Problem Relation Age of Onset  . Asthma Mother     Social History   Socioeconomic History  . Marital status: Single    Spouse name: Not on file  . Number of children: Not on file  . Years of education: Not on file  . Highest education level: Not on file  Occupational  History  . Not on file  Tobacco Use  . Smoking status: Former Smoker    Quit date: 08/20/2019    Years since quitting: 0.7  . Smokeless tobacco: Never Used  Vaping Use  . Vaping Use: Never used  Substance and Sexual Activity  . Alcohol use: No  . Drug use: No  . Sexual activity: Yes    Partners: Male    Birth control/protection: None  Other Topics Concern  . Not on file  Social History Narrative   2 people living in the home. Mother and teen    pet yorkipoo   Moved from [place]   Father high school incarcerated    mother  Janice Sutton ;history of asthma; MBA accounting and finance   Negative ETS /FA   Social Determinants of Health   Financial Resource Strain:   . Difficulty of Paying Living Expenses:   Food Insecurity:   . Worried About Programme researcher, broadcasting/film/video in the Last Year:   . Barista in the Last Year:   Transportation Needs:   . Freight forwarder (Medical):   Marland Kitchen Lack of Transportation (Non-Medical):   Physical Activity:   . Days of Exercise per  Week:   . Minutes of Exercise per Session:   Stress:   . Feeling of Stress :   Social Connections:   . Frequency of Communication with Friends and Family:   . Frequency of Social Gatherings with Friends and Family:   . Attends Religious Services:   . Active Member of Clubs or Organizations:   . Attends Banker Meetings:   Marland Kitchen Marital Status:   Intimate Partner Violence:   . Fear of Current or Ex-Partner:   . Emotionally Abused:   Marland Kitchen Physically Abused:   . Sexually Abused:     Review of Systems  All other systems reviewed and are negative.   PHYSICAL EXAMINATION:    BP 90/68   Pulse (!) 105   Ht 5\' 2"  (1.575 m)   Wt 119 lb (54 kg)   LMP 04/18/2020   SpO2 99%   BMI 21.77 kg/m     General appearance: alert, cooperative and appears stated age  Pelvic: External genitalia:  no lesions, no lump seen or palpated. Mild erythema (denies symptoms)              Urethra:  normal  appearing urethra with no masses, tenderness or lesions              Bartholins and Skenes: normal                  Chaperone was present for exam.  ASSESSMENT Normal exam, suspect she had a boil, resolved Recent diagnosis of HSV I on serology. No h/o cold sores or genital lesions.     PLAN Reassured that her exam is normal Counseled on HSV I, information given from Up To Date. We discussed not having oral or oral genital contact with any symptoms We discussed that condoms can help decrease the risk of transmission of HSV We discussed the possibility of suppression if needed

## 2020-05-30 NOTE — Progress Notes (Signed)
19 y.o. G1P0000 Single Black or African American Not Hispanic or Latino female here for annual exam.  Patient is requesting to switch from birth control pill to the depo injection. She is also requesting a refill on bethamethasone. cream.  Patient states that she has been loosing weight.  She hasn't been sexually active in the last month. She has had some late pills, having trouble remembering. She wants to restart depoprovera.  Sexually active with the same partner off and on since 2016. They use condoms.  Period Cycle (Days): 28 Period Duration (Days): 6 Period Pattern: Regular Menstrual Flow: Moderate Menstrual Control: Tampon Menstrual Control Change Freq (Hours): 1-2 Dysmenorrhea: (!) Mild Dysmenorrhea Symptoms: Cramping   Treated for her 3rd episode of BV in a year, last month. Treated with metrogel and started on suppression.  Recently diagnosed with +HSV serology.   Patient's last menstrual period was 04/25/2020 (approximate).          Sexually active: Yes.    The current method of family planning is oral progesterone-only contraceptive.    Exercising: No.  The patient does not participate in regular exercise at present. Smoker:  Vap's. Trying to stop, can go a week without it.   Health Maintenance: Pap:  None  History of abnormal Pap:  no TDaP:  Unsure Gardasil: 07/21/14   reports that she quit smoking about 9 months ago. She has never used smokeless tobacco. She reports that she does not drink alcohol and does not use drugs. 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 Will start working for [employer] in 9/21.   Past Medical History:  Diagnosis Date  . ADD (attention deficit disorder)   . STD (sexually transmitted disease) 07/2016   Chlamydia     History reviewed. No pertinent surgical history.  Current Outpatient Medications  Medication Sig Dispense Refill  . norethindrone-ethinyl estradiol (LOESTRIN FE) 1-20 MG-MCG tablet Take 1 tablet by mouth  daily. 84 tablet 0   No current facility-administered medications for this visit.    Family History  Problem Relation Age of Onset  . Asthma Mother     Review of Systems  Constitutional: Positive for unexpected weight change.  she c/o vulvar irritation for one day, not itchy.   Exam:   BP 90/68   Pulse 74   Ht 5\' 2"  (1.575 m)   Wt 113 lb (51.3 kg)   LMP 04/25/2020 (Approximate)   SpO2 100%   BMI 20.67 kg/m   Weight change: @WEIGHTCHANGE @ Height:   Height: 5\' 2"  (157.5 cm)  Ht Readings from Last 3 Encounters:  06/01/20 5\' 2"  (1.575 m)  05/15/20 5\' 2"  (1.575 m)  04/11/20 5\' 2"  (1.575 m)    General appearance: alert, cooperative and appears stated age Head: Normocephalic, without obvious abnormality, atraumatic Neck: no adenopathy, supple, symmetrical, trachea midline and thyroid normal to inspection and palpation Lungs: clear to auscultation bilaterally Cardiovascular: regular rate and rhythm Breasts: normal appearance, no masses or tenderness Abdomen: soft, non-tender; non distended,  no masses,  no organomegaly Extremities: extremities normal, atraumatic, no cyanosis or edema Skin: Skin color, texture, turgor normal. No rashes or lesions Lymph nodes: Cervical, supraclavicular, and axillary nodes normal. No abnormal inguinal nodes palpated Neurologic: Grossly normal   Pelvic: External genitalia: some whitening, small fissure on her perineum              Urethra:  normal appearing urethra with no masses, tenderness or lesions  Bartholins and Skenes: normal                 Vagina: normal appearing vagina with normal color, thick white vaginal d/c (she is using metrogel)              Cervix: no lesions               Bimanual Exam:  Uterus:  normal size, contour, position, consistency, mobility, non-tender              Adnexa: no mass, fullness, tenderness               Rectovaginal: Confirms               Anus:  normal sphincter tone, no lesions  [name] chaperoned for the exam.  A:  Well Woman with normal exam  Vulvitis  H/o recurrent BV, on metrogel suppression  P:   Pap with STD testing  Recent normal blood work  UPT if negative start depoprovera 150 mg IM q 3 months x 1 year  Condoms for STD's  Discussed breast self exam  Discussed calcium and vit D intake   2nd gardasil today  TDAP today  nuswab vaginitis panel  Valisone for short term use  Vulvar skin care sheet given

## 2020-06-01 ENCOUNTER — Other Ambulatory Visit: Payer: Self-pay

## 2020-06-01 ENCOUNTER — Encounter: Payer: Self-pay | Admitting: Obstetrics and Gynecology

## 2020-06-01 ENCOUNTER — Ambulatory Visit (INDEPENDENT_AMBULATORY_CARE_PROVIDER_SITE_OTHER): Payer: 59 | Admitting: Obstetrics and Gynecology

## 2020-06-01 ENCOUNTER — Other Ambulatory Visit (HOSPITAL_COMMUNITY)
Admission: RE | Admit: 2020-06-01 | Discharge: 2020-06-01 | Disposition: A | Discharge status: Home / Self Care | Payer: 59 | Source: Ambulatory Visit | Attending: Obstetrics and Gynecology | Admitting: Obstetrics and Gynecology

## 2020-06-01 VITALS — BP 90/68 | HR 74 | Ht 62.0 in | Wt 113.0 lb

## 2020-06-01 DIAGNOSIS — Z01419 Encounter for gynecological examination (general) (routine) without abnormal findings: Secondary | ICD-10-CM

## 2020-06-01 DIAGNOSIS — Z3042 Encounter for surveillance of injectable contraceptive: Secondary | ICD-10-CM

## 2020-06-01 DIAGNOSIS — Z23 Encounter for immunization: Secondary | ICD-10-CM | POA: Diagnosis not present

## 2020-06-01 DIAGNOSIS — Z124 Encounter for screening for malignant neoplasm of cervix: Secondary | ICD-10-CM

## 2020-06-01 DIAGNOSIS — Z7189 Other specified counseling: Secondary | ICD-10-CM

## 2020-06-01 DIAGNOSIS — Z113 Encounter for screening for infections with a predominantly sexual mode of transmission: Secondary | ICD-10-CM | POA: Insufficient documentation

## 2020-06-01 DIAGNOSIS — Z3009 Encounter for other general counseling and advice on contraception: Secondary | ICD-10-CM | POA: Diagnosis not present

## 2020-06-01 DIAGNOSIS — Z7185 Encounter for immunization safety counseling: Secondary | ICD-10-CM

## 2020-06-01 DIAGNOSIS — N762 Acute vulvitis: Secondary | ICD-10-CM

## 2020-06-01 MED ORDER — BETAMETHASONE VALERATE 0.1 % EX OINT
TOPICAL_OINTMENT | CUTANEOUS | 0 refills | Status: DC
Start: 2020-06-01 — End: 2020-08-22

## 2020-06-01 MED ORDER — MEDROXYPROGESTERONE ACETATE 150 MG/ML IM SUSP
150.0000 mg | Freq: Once | INTRAMUSCULAR | Status: AC
  Administered 2020-06-01: 150 mg via INTRAMUSCULAR

## 2020-06-01 NOTE — Patient Instructions (Addendum)
EXERCISE AND DIET:  We recommended that you start or continue a regular exercise program for good health. Regular exercise means any activity that makes your heart beat faster and makes you sweat.  We recommend exercising at least 30 minutes per day at least 3 days a week, preferably 4 or 5.  We also recommend a diet low in fat and sugar.  Inactivity, poor dietary choices and obesity can cause diabetes, heart attack, stroke, and kidney damage, among others.    ALCOHOL AND SMOKING:  Women should limit their alcohol intake to no more than 7 drinks/beers/glasses of wine (combined, not each!) per week. Moderation of alcohol intake to this level decreases your risk of breast cancer and liver damage. And of course, no recreational drugs are part of a healthy lifestyle.  And absolutely no smoking or even second hand smoke. Most people know smoking can cause heart and lung diseases, but did you know it also contributes to weakening of your bones? Aging of your skin?  Yellowing of your teeth and nails?  CALCIUM AND VITAMIN D:  Adequate intake of calcium and Vitamin D are recommended.  The recommendations for exact amounts of these supplements seem to change often, but generally speaking 1,000 mg of calcium (between diet and supplement) and 800 units of Vitamin D per day seems prudent. Certain women may benefit from higher intake of Vitamin D.  If you are among these women, your doctor will have told you during your visit.    PAP SMEARS:  Pap smears, to check for cervical cancer or precancers,  have traditionally been done yearly, although recent scientific advances have shown that most women can have pap smears less often.  However, every woman still should have a physical exam from her gynecologist every year. It will include a breast check, inspection of the vulva and vagina to check for abnormal growths or skin changes, a visual exam of the cervix, and then an exam to evaluate the size and shape of the uterus and  ovaries.  And after 21 years of age, a rectal exam is indicated to check for rectal cancers. We will also provide age appropriate advice regarding health maintenance, like when you should have certain vaccines, screening for sexually transmitted diseases, bone density testing, colonoscopy, mammograms, etc.   MAMMOGRAMS:  All women over 40 years old should have a yearly mammogram. Many facilities now offer a "3D" mammogram, which may cost around $50 extra out of pocket. If possible,  we recommend you accept the option to have the 3D mammogram performed.  It both reduces the number of women who will be called back for extra views which then turn out to be normal, and it is better than the routine mammogram at detecting truly abnormal areas.    COLON CANCER SCREENING: Now recommend starting at age 45. At this time colonoscopy is not covered for routine screening until 50. There are take home tests that can be done between 45-49.   COLONOSCOPY:  Colonoscopy to screen for colon cancer is recommended for all women at age 50.  We know, you hate the idea of the prep.  We agree, BUT, having colon cancer and not knowing it is worse!!  Colon cancer so often starts as a polyp that can be seen and removed at colonscopy, which can quite literally save your life!  And if your first colonoscopy is normal and you have no family history of colon cancer, most women don't have to have it again for   10 years.  Once every ten years, you can do something that may end up saving your life, right?  We will be happy to help you get it scheduled when you are ready.  Be sure to check your insurance coverage so you understand how much it will cost.  It may be covered as a preventative service at no cost, but you should check your particular policy.      Breast Self-Awareness Breast self-awareness means being familiar with how your breasts look and feel. It involves checking your breasts regularly and reporting any changes to your  health care provider. Practicing breast self-awareness is important. A change in your breasts can be a sign of a serious medical problem. Being familiar with how your breasts look and feel allows you to find any problems early, when treatment is more likely to be successful. All women should practice breast self-awareness, including women who have had breast implants. How to do a breast self-exam One way to learn what is normal for your breasts and whether your breasts are changing is to do a breast self-exam. To do a breast self-exam: Look for Changes  1. Remove all the clothing above your waist. 2. Stand in front of a mirror in a room with good lighting. 3. Put your hands on your hips. 4. Push your hands firmly downward. 5. Compare your breasts in the mirror. Look for differences between them (asymmetry), such as: ? Differences in shape. ? Differences in size. ? Puckers, dips, and bumps in one breast and not the other. 6. Look at each breast for changes in your skin, such as: ? Redness. ? Scaly areas. 7. Look for changes in your nipples, such as: ? Discharge. ? Bleeding. ? Dimpling. ? Redness. ? A change in position. Feel for Changes Carefully feel your breasts for lumps and changes. It is best to do this while lying on your back on the floor and again while sitting or standing in the shower or tub with soapy water on your skin. Feel each breast in the following way:  Place the arm on the side of the breast you are examining above your head.  Feel your breast with the other hand.  Start in the nipple area and make  inch (2 cm) overlapping circles to feel your breast. Use the pads of your three middle fingers to do this. Apply light pressure, then medium pressure, then firm pressure. The light pressure will allow you to feel the tissue closest to the skin. The medium pressure will allow you to feel the tissue that is a little deeper. The firm pressure will allow you to feel the tissue  close to the ribs.  Continue the overlapping circles, moving downward over the breast until you feel your ribs below your breast.  Move one finger-width toward the center of the body. Continue to use the  inch (2 cm) overlapping circles to feel your breast as you move slowly up toward your collarbone.  Continue the up and down exam using all three pressures until you reach your armpit.  Write Down What You Find  Write down what is normal for each breast and any changes that you find. Keep a written record with breast changes or normal findings for each breast. By writing this information down, you do not need to depend only on memory for size, tenderness, or location. Write down where you are in your menstrual cycle, if you are still menstruating. If you are having trouble noticing differences   in your breasts, do not get discouraged. With time you will become more familiar with the variations in your breasts and more comfortable with the exam. How often should I examine my breasts? Examine your breasts every month. If you are breastfeeding, the best time to examine your breasts is after a feeding or after using a breast pump. If you menstruate, the best time to examine your breasts is 5-7 days after your period is over. During your period, your breasts are lumpier, and it may be more difficult to notice changes. When should I see my health care provider? See your health care provider if you notice:  A change in shape or size of your breasts or nipples.  A change in the skin of your breast or nipples, such as a reddened or scaly area.  Unusual discharge from your nipples.  A lump or thick area that was not there before.  Pain in your breasts.  Anything that concerns you.  

## 2020-06-02 LAB — CYTOLOGY - PAP
Chlamydia: NEGATIVE
Comment: NEGATIVE
Comment: NORMAL
Diagnosis: NEGATIVE
Neisseria Gonorrhea: POSITIVE — AB

## 2020-06-03 LAB — NUSWAB VAGINITIS (VG)
Candida albicans, NAA: POSITIVE — AB
Candida glabrata, NAA: NEGATIVE
Trich vag by NAA: NEGATIVE

## 2020-06-05 ENCOUNTER — Other Ambulatory Visit: Payer: Self-pay | Admitting: *Deleted

## 2020-06-05 ENCOUNTER — Ambulatory Visit (INDEPENDENT_AMBULATORY_CARE_PROVIDER_SITE_OTHER): Payer: 59 | Admitting: *Deleted

## 2020-06-05 ENCOUNTER — Ambulatory Visit: Payer: Self-pay

## 2020-06-05 ENCOUNTER — Other Ambulatory Visit: Payer: Self-pay

## 2020-06-05 ENCOUNTER — Other Ambulatory Visit: Payer: Self-pay | Admitting: Obstetrics and Gynecology

## 2020-06-05 VITALS — BP 100/62 | HR 70 | Resp 14 | Ht 62.0 in | Wt 117.0 lb

## 2020-06-05 DIAGNOSIS — Z8619 Personal history of other infectious and parasitic diseases: Secondary | ICD-10-CM

## 2020-06-05 DIAGNOSIS — Z113 Encounter for screening for infections with a predominantly sexual mode of transmission: Secondary | ICD-10-CM

## 2020-06-05 DIAGNOSIS — A549 Gonococcal infection, unspecified: Secondary | ICD-10-CM

## 2020-06-05 MED ORDER — CEFTRIAXONE SODIUM 500 MG IJ SOLR
500.0000 mg | Freq: Once | INTRAMUSCULAR | Status: AC
  Administered 2020-06-05: 500 mg via INTRAMUSCULAR

## 2020-06-05 MED ORDER — FLUCONAZOLE 150 MG PO TABS: ORAL_TABLET | ORAL | 0 refills | Status: DC

## 2020-06-05 NOTE — Progress Notes (Signed)
Patient here for ceftriaxone 500mg  injection. Injection given in [facility] and patient tolerated well. Patient was monitored for 15 minutes following injection. Patient advised again partner will need to be treated and to avoid intercourse for 1 week after everyone has been treated and use condoms. Patient verbalized understanding. 3 month TOC follow up scheduled for 08-25-20 at 1600. Patient agreeable to date and time of appointment.   Routing to provider and will close encounter.

## 2020-06-06 LAB — HEP, RPR, HIV PANEL
HIV Screen 4th Generation wRfx: NONREACTIVE
Hepatitis B Surface Ag: NEGATIVE
RPR Ser Ql: NONREACTIVE

## 2020-06-06 LAB — HEPATITIS C ANTIBODY: Hep C Virus Ab: <0.1 s/co ratio (ref 0.0–0.9)

## 2020-06-23 ENCOUNTER — Telehealth: Payer: Self-pay | Admitting: Obstetrics and Gynecology

## 2020-06-23 NOTE — Telephone Encounter (Signed)
Spoke with patient. Patient is requesting an OV for STD testing.  Hx of chlamydia and gonorrhea.  Tx with IM rocephin 06/05/20 for gonorrhea. Patient reports she abstained for 1 wk after tx. Patient has since been SA with a new partner, has been using condoms.  Denies any current symptoms. Is currently scheduled for TOC 08/25/20.  OV scheduled for 07/03/20 at 11:30am. Advised if any symptoms develop, return call to office for earlier OV. Advised I will update Dr. Oscar La, our office will return call if any additional recommendations.   Routing to provider for final review. Patient is agreeable to disposition. Will close encounter.

## 2020-06-23 NOTE — Telephone Encounter (Signed)
Patent wants to come in for STD testing.

## 2020-07-03 ENCOUNTER — Ambulatory Visit: Payer: 59 | Admitting: Obstetrics and Gynecology

## 2020-07-03 ENCOUNTER — Telehealth: Payer: Self-pay | Admitting: Obstetrics and Gynecology

## 2020-07-03 NOTE — Telephone Encounter (Signed)
Patient arrived to appointment and decided to cancel and reschedule after talking with patient accounting department.

## 2020-07-03 NOTE — Progress Notes (Deleted)
GYNECOLOGY  VISIT   HPI: 19 y.o.   Single Black or African American Not Hispanic or Latino  female   G1P0000 with No LMP recorded. (Menstrual status: Other).   here for STD testing    GYNECOLOGIC HISTORY: No LMP recorded. (Menstrual status: Other). Contraception:*** Menopausal hormone therapy: n/a        OB History    Gravida  1   Para  0   Term  0   Preterm  0   AB  0   Living  0     SAB  0   TAB  0   Ectopic  0   Multiple  0   Live Births                 Patient Active Problem List   Diagnosis Date Noted  . Depo-Provera contraceptive status 01/18/2019  . History of chlamydia 01/18/2019  . Dysmenorrhea 07/24/2014  . Irregular menstrual cycle 07/24/2014  . History of ADHD 04/16/2013  . ADHD (attention deficit hyperactivity disorder) 04/16/2013    Past Medical History:  Diagnosis Date  . ADD (attention deficit disorder)   . STD (sexually transmitted disease) 07/2016   Chlamydia     No past surgical history on file.  Current Outpatient Medications  Medication Sig Dispense Refill  . betamethasone valerate ointment (VALISONE) 0.1 % Use a pea sized amount topically BID for up to one week as needed 15 g 0  . fluconazole (DIFLUCAN) 150 MG tablet Take one tablet now PO. May repeat in 72 hours if symptoms still present. 2 tablet 0  . medroxyPROGESTERone (DEPO-PROVERA) 150 MG/ML injection Inject 150 mg into the muscle every 3 (three) months.     No current facility-administered medications for this visit.     ALLERGIES: Patient has no known allergies.  Family History  Problem Relation Age of Onset  . Asthma Mother     Social History   Socioeconomic History  . Marital status: Single    Spouse name: Not on file  . Number of children: Not on file  . Years of education: Not on file  . Highest education level: Not on file  Occupational History  . Not on file  Tobacco Use  . Smoking status: Former Smoker    Quit date: 08/20/2019    Years since  quitting: 0.8  . Smokeless tobacco: Never Used  Vaping Use  . Vaping Use: Never used  Substance and Sexual Activity  . Alcohol use: No  . Drug use: No  . Sexual activity: Yes    Partners: Male    Birth control/protection: None  Other Topics Concern  . Not on file  Social History Narrative   2 people living in the home. Mother and teen    pet yorkipoo   Moved from [place]   Father high school incarcerated    mother  Janice Sutton ;history of asthma; MBA accounting and finance   Negative ETS /FA   Social Determinants of Health   Financial Resource Strain:   . Difficulty of Paying Living Expenses: Not on file  Food Insecurity:   . Worried About Programme researcher, broadcasting/film/video in the Last Year: Not on file  . Ran Out of Food in the Last Year: Not on file  Transportation Needs:   . Lack of Transportation (Medical): Not on file  . Lack of Transportation (Non-Medical): Not on file  Physical Activity:   . Days of Exercise per Week: Not on file  .  Minutes of Exercise per Session: Not on file  Stress:   . Feeling of Stress : Not on file  Social Connections:   . Frequency of Communication with Friends and Family: Not on file  . Frequency of Social Gatherings with Friends and Family: Not on file  . Attends Religious Services: Not on file  . Active Member of Clubs or Organizations: Not on file  . Attends Banker Meetings: Not on file  . Marital Status: Not on file  Intimate Partner Violence:   . Fear of Current or Ex-Partner: Not on file  . Emotionally Abused: Not on file  . Physically Abused: Not on file  . Sexually Abused: Not on file    ROS  PHYSICAL EXAMINATION:    There were no vitals taken for this visit.    General appearance: alert, cooperative and appears stated age Neck: no adenopathy, supple, symmetrical, trachea midline and thyroid {CHL AMB PHY EX THYROID NORM DEFAULT:[phone]::"normal to inspection and palpation"} Breasts: {Exam;  breast:13139::"normal appearance, no masses or tenderness"} Abdomen: soft, non-tender; non distended, no masses,  no organomegaly  Pelvic: External genitalia:  no lesions              Urethra:  normal appearing urethra with no masses, tenderness or lesions              Bartholins and Skenes: normal                 Vagina: normal appearing vagina with normal color and discharge, no lesions              Cervix: {CHL AMB PHY EX CERVIX NORM DEFAULT:[phone]::"no lesions"}              Bimanual Exam:  Uterus:  {CHL AMB PHY EX UTERUS NORM DEFAULT:[phone]::"normal size, contour, position, consistency, mobility, non-tender"}              Adnexa: {CHL AMB PHY EX ADNEXA NO MASS DEFAULT:[id number]::"no mass, fullness, tenderness"}              Rectovaginal: {yes no:[id number]}.  Confirms.              Anus:  normal sphincter tone, no lesions  Chaperone was present for exam.  ASSESSMENT     PLAN    An After Visit Summary was printed and given to the patient.  *** minutes face to face time of which over 50% was spent in counseling.

## 2020-07-11 ENCOUNTER — Encounter: Payer: Self-pay | Admitting: Obstetrics and Gynecology

## 2020-07-11 ENCOUNTER — Ambulatory Visit: Payer: 59 | Admitting: Obstetrics and Gynecology

## 2020-07-11 ENCOUNTER — Telehealth: Payer: Self-pay

## 2020-07-11 NOTE — Telephone Encounter (Signed)
Patient did not keep office visit for today.  °

## 2020-07-20 ENCOUNTER — Telehealth: Payer: Self-pay

## 2020-07-20 NOTE — Telephone Encounter (Signed)
Called patient to reschedule office visit for 3 month TOC. Patient will call back to schedule when receive new insurance.

## 2020-07-20 NOTE — Telephone Encounter (Signed)
Pt to call back for r/s of TOC when has insurance. Reminder note made. Encounter closed

## 2020-08-17 ENCOUNTER — Other Ambulatory Visit: Payer: Self-pay | Admitting: Obstetrics and Gynecology

## 2020-08-17 DIAGNOSIS — A549 Gonococcal infection, unspecified: Secondary | ICD-10-CM

## 2020-08-18 NOTE — Telephone Encounter (Signed)
Medication refill request: Flucanazole 150mg   Last AEX:  06/01/20 Next AEX: not scheduled  Last MMG (if hormonal medication request): NA Refill authorized: 2/0   Medication refill request: Metronidazole 500mg   Last AEX:  06/01/20  Next AEX: not scheduled  Last MMG (if hormonal medication request): NA  Refill authorized: 14/0

## 2020-08-18 NOTE — Telephone Encounter (Signed)
If she is having symptoms she should be seen.

## 2020-08-21 NOTE — Progress Notes (Signed)
Chief Complaint  Patient presents with  . Follow-up    wants STI tested    HPI: Janice Sutton 19 y.o. come in for SDA   appt  Asks about getting her Depo shot which usually gets her GYN but has a difficult work schedule and they did not have an opening that would fit her schedule. Would like STI testing has no significant symptoms today although slight irritation has treated over-the-counter miconazole and is also had a steroid topical which she has for refill.  No new partner no recent exposure. She has been seen by GYNE  b   8 21 and dx  GC apparently asymptomatic had rocephin   rx 8 30 and   supposed to have toc or follow-up testing Per gyne  Per 10 21 notes . Generic  Monistat   3 days ago no other recent treatments. Declines blood work testing for syphilis a and HIV at this time..    ROS: See pertinent positives and negatives per HPI.  History of recurrent throat infections currently not bad.  Past Medical History:  Diagnosis Date  . ADD (attention deficit disorder)   . STD (sexually transmitted disease) 07/2016   Chlamydia     Family History  Problem Relation Age of Onset  . Asthma Mother     Social History   Socioeconomic History  . Marital status: Single    Spouse name: Not on file  . Number of children: Not on file  . Years of education: Not on file  . Highest education level: Not on file  Occupational History  . Not on file  Tobacco Use  . Smoking status: Former Smoker    Quit date: 08/20/2019    Years since quitting: 1.0  . Smokeless tobacco: Never Used  Vaping Use  . Vaping Use: Never used  Substance and Sexual Activity  . Alcohol use: No  . Drug use: No  . Sexual activity: Yes    Partners: Male    Birth control/protection: None  Other Topics Concern  . Not on file  Social History Narrative   2 people living in the home. Mother and teen    pet yorkipoo   Moved from [place]   Father high school incarcerated    mother  Janice Sutton ;history of asthma; MBA accounting and finance   Negative ETS /FA   Social Determinants of Health   Financial Resource Strain:   . Difficulty of Paying Living Expenses: Not on file  Food Insecurity:   . Worried About Programme researcher, broadcasting/film/video in the Last Year: Not on file  . Ran Out of Food in the Last Year: Not on file  Transportation Needs:   . Lack of Transportation (Medical): Not on file  . Lack of Transportation (Non-Medical): Not on file  Physical Activity:   . Days of Exercise per Week: Not on file  . Minutes of Exercise per Session: Not on file  Stress:   . Feeling of Stress : Not on file  Social Connections:   . Frequency of Communication with Friends and Family: Not on file  . Frequency of Social Gatherings with Friends and Family: Not on file  . Attends Religious Services: Not on file  . Active Member of Clubs or Organizations: Not on file  . Attends Banker Meetings: Not on file  . Marital Status: Not on file    Outpatient Medications Prior to Visit  Medication Sig Dispense Refill  . medroxyPROGESTERone (  DEPO-PROVERA) 150 MG/ML injection Inject 150 mg into the muscle every 3 (three) months.    . betamethasone valerate ointment (VALISONE) 0.1 % Use a pea sized amount topically BID for up to one week as needed 15 g 0  . fluconazole (DIFLUCAN) 150 MG tablet Take one tablet now PO. May repeat in 72 hours if symptoms still present. 2 tablet 0   No facility-administered medications prior to visit.     EXAM:  BP 110/72   Pulse 88   Temp 98.4 F (36.9 C) (Oral)   Ht 5\' 2"  (1.575 m)   Wt 118 lb (53.5 kg)   SpO2 100%   BMI 21.58 kg/m   Body mass index is 21.58 kg/m.  GENERAL: vitals reviewed and listed above, alert, oriented, appears well hydrated and in no acute distress HEENT: atraumatic, conjunctiva  clear, no obvious abnormalities on inspection of external nose and ears OP : no lesion edema or exudate minimal redness no edema NECK: no  obvious masses on inspection palpation  CV: HRRR, no clubbing cyanosis or  peripheral edema nl cap refill  MS: moves all extremities without noticeable focal  abnormality PSYCH: pleasant and cooperative, no obvious depression or anxiety Lab Results  Component Value Date   WBC 5.6 01/11/2020   HGB 14.1 01/11/2020   HCT 42.1 01/11/2020   PLT 247.0 01/11/2020   GLUCOSE 69 (L) 09/29/2019   CHOL 105 05/26/2019   TRIG 70 05/26/2019   HDL 61 05/26/2019   LDLCALC 30 05/26/2019   ALT 13 09/29/2019   AST 13 09/29/2019   NA 137 09/29/2019   K 3.9 09/29/2019   CL 103 09/29/2019   CREATININE 0.87 09/29/2019   BUN 9 09/29/2019   CO2 26 09/29/2019   TSH 1.31 09/29/2019   BP Readings from Last 3 Encounters:  08/22/20 110/72  06/05/20 100/62  06/01/20 90/68   Record review. ASSESSMENT AND PLAN:  Discussed the following assessment and plan:  Routine screening for STI (sexually transmitted infection) - Plan: Urine cytology ancillary only, POCT urine pregnancy, Urine cytology ancillary only  History of gonorrhea - rx rocephin   currently no sx   History of chlamydia  Depo-Provera contraceptive status - neg ucg will waot until 23 to get her injection   offered to give today  Hx tonsillitis    Disc options declined  Blood testing  No sx disc exposures    -Patient advised to return or notify health care team  if  new concerns arise.  Patient Instructions  Testing today for gc chlamydia screen    Sent in  Steroid topical   Refill .  Get your depo   Janice Sutton know how we can help  Your throat looks ok  today .   [place]. Janice Sutton M.D.

## 2020-08-21 NOTE — Telephone Encounter (Signed)
Spoke with pt. Pt states requesting medication for vaginal irritation and discharge. Pt states also needing STD testing. Denies any vaginal odor, recent exposure to STDs. Pt due for TOC from + Gonorrhea in 05/2020. Pt has cancelled previous appts due to insurance change. Pt states unsure when new insurance will be in effective and if our office is in network. Pt advised to call insurance for coverage benefits.  Pt advised to have OV for evaluation. Pt declines OV at this time. Reviewed options of care.  Pt has additional questions for business office. Message sent to business office for return call to pt. Pt agreeable.  Encounter closed

## 2020-08-22 ENCOUNTER — Encounter: Payer: Self-pay | Admitting: Internal Medicine

## 2020-08-22 ENCOUNTER — Other Ambulatory Visit (HOSPITAL_COMMUNITY)
Admission: RE | Admit: 2020-08-22 | Discharge: 2020-08-22 | Disposition: A | Discharge status: Home / Self Care | Payer: 59 | Source: Ambulatory Visit | Attending: Internal Medicine | Admitting: Internal Medicine

## 2020-08-22 ENCOUNTER — Ambulatory Visit (INDEPENDENT_AMBULATORY_CARE_PROVIDER_SITE_OTHER): Payer: 59 | Admitting: Internal Medicine

## 2020-08-22 ENCOUNTER — Other Ambulatory Visit: Payer: Self-pay

## 2020-08-22 VITALS — BP 110/72 | HR 88 | Temp 98.4°F | Ht 62.0 in | Wt 118.0 lb

## 2020-08-22 DIAGNOSIS — Z3042 Encounter for surveillance of injectable contraceptive: Secondary | ICD-10-CM

## 2020-08-22 DIAGNOSIS — Z8619 Personal history of other infectious and parasitic diseases: Secondary | ICD-10-CM | POA: Diagnosis not present

## 2020-08-22 DIAGNOSIS — Z113 Encounter for screening for infections with a predominantly sexual mode of transmission: Secondary | ICD-10-CM

## 2020-08-22 LAB — POCT URINE PREGNANCY: Preg Test, Ur: NEGATIVE

## 2020-08-22 MED ORDER — BETAMETHASONE VALERATE 0.1 % EX OINT
TOPICAL_OINTMENT | CUTANEOUS | 0 refills | Status: DC
Start: 2020-08-22 — End: 2020-12-01

## 2020-08-22 NOTE — Patient Instructions (Addendum)
Testing today for gc chlamydia screen    Sent in  Steroid topical   Refill .  Get your depo   Let us know how we can help  Your throat looks ok  today .

## 2020-08-24 LAB — URINE CYTOLOGY ANCILLARY ONLY
Chlamydia: NEGATIVE
Comment: NEGATIVE
Comment: NORMAL
Neisseria Gonorrhea: NEGATIVE

## 2020-08-25 ENCOUNTER — Ambulatory Visit: Payer: 59 | Admitting: Obstetrics and Gynecology

## 2020-08-25 NOTE — Progress Notes (Signed)
Negative gc chlamydia screen

## 2020-11-24 ENCOUNTER — Telehealth: Payer: Self-pay | Admitting: Internal Medicine

## 2020-11-24 NOTE — Telephone Encounter (Signed)
Pt call and stated she need a refill on betamethasone valerate ointment (VALISONE) 0.1 % sent to  [id number] Pura Spice, [place] - [address] AT [facility] [address] Phone:  [phone]  Fax:  [phone]     v

## 2020-11-27 NOTE — Telephone Encounter (Signed)
Okay to refill? 

## 2020-12-01 ENCOUNTER — Other Ambulatory Visit: Payer: Self-pay | Admitting: Internal Medicine

## 2020-12-01 MED ORDER — BETAMETHASONE VALERATE 0.1 % EX OINT: TOPICAL_OINTMENT | CUTANEOUS | 0 refills | Status: DC

## 2020-12-01 NOTE — Progress Notes (Unsigned)
I sent in refill

## 2020-12-01 NOTE — Telephone Encounter (Signed)
I sent in refill

## 2021-01-04 ENCOUNTER — Other Ambulatory Visit (HOSPITAL_COMMUNITY)
Admission: RE | Admit: 2021-01-04 | Discharge: 2021-01-04 | Disposition: A | Discharge status: Home / Self Care | Payer: 59 | Source: Ambulatory Visit | Attending: Family Medicine | Admitting: Family Medicine

## 2021-01-04 ENCOUNTER — Other Ambulatory Visit: Payer: Self-pay

## 2021-01-04 ENCOUNTER — Ambulatory Visit (INDEPENDENT_AMBULATORY_CARE_PROVIDER_SITE_OTHER): Payer: 59 | Admitting: Family Medicine

## 2021-01-04 ENCOUNTER — Encounter: Payer: Self-pay | Admitting: Family Medicine

## 2021-01-04 VITALS — BP 102/58 | HR 100 | Temp 97.5°F

## 2021-01-04 DIAGNOSIS — N912 Amenorrhea, unspecified: Secondary | ICD-10-CM

## 2021-01-04 DIAGNOSIS — N760 Acute vaginitis: Secondary | ICD-10-CM

## 2021-01-04 DIAGNOSIS — N3001 Acute cystitis with hematuria: Secondary | ICD-10-CM

## 2021-01-04 DIAGNOSIS — R300 Dysuria: Secondary | ICD-10-CM | POA: Diagnosis not present

## 2021-01-04 LAB — POCT URINALYSIS DIPSTICK
Bilirubin, UA: NEGATIVE
Glucose, UA: NEGATIVE
Ketones, UA: NEGATIVE
Nitrite, UA: NEGATIVE
Protein, UA: POSITIVE — AB
Spec Grav, UA: 1.015 (ref 1.010–1.025)
Urobilinogen, UA: NEGATIVE E.U./dL — AB
pH, UA: 7 (ref 5.0–8.0)

## 2021-01-04 MED ORDER — AMOXICILLIN-POT CLAVULANATE 500-125 MG PO TABS: 1.0000 | ORAL_TABLET | Freq: Two times a day (BID) | ORAL | 0 refills | Status: DC

## 2021-01-04 MED ORDER — FLUCONAZOLE 150 MG PO TABS: 150.0000 mg | ORAL_TABLET | Freq: Once | ORAL | 0 refills | Status: AC

## 2021-01-04 NOTE — Progress Notes (Signed)
Subjective:    Patient ID: Janice Sutton, female    DOB: [birth year], 20 y.o.   MRN: [id number]  Chief Complaint  Patient presents with  . Vaginal Itching    HPI Patient is a 20 year old female with no significant PMH seen by Dr. Fabian Sharp.  Pt seen today for acute concern.  Patient with vaginal itching/irritation x 1 wk and pressure after urination x 2 wks.  Tried betamethasone ointment and increasing intake of water and fluids.  Uses sensitive oatmeal soap 2/2 h/o psoriasis.  Endorses whitish vaginal discharge.  Absent menses x yrs since being on Depo. Has not had a depo injection since Sept 2021?Marland Kitchen Last unprotected intercourse 1 month ago.  Pt notes breast tenderness.  Past Medical History:  Diagnosis Date  . ADD (attention deficit disorder)   . STD (sexually transmitted disease) 07/2016   Chlamydia     No Known Allergies  ROS General: Denies fever, chills, night sweats, changes in weight, changes in appetite HEENT: Denies headaches, ear pain, changes in vision, rhinorrhea, sore throat CV: Denies CP, palpitations, SOB, orthopnea Pulm: Denies SOB, cough, wheezing GI: Denies abdominal pain, nausea, vomiting, diarrhea, constipation GU: Denies dysuria, hematuria   + vaginal discharge, vaginal irritation, urinary pressure, breast tenderness Msk: Denies muscle cramps, joint pains Neuro: Denies weakness, numbness, tingling Skin: Denies rashes, bruising Psych: Denies depression, anxiety, hallucinations     Objective:    Blood pressure (!) 102/58, pulse 100, temperature (!) 97.5 F (36.4 C), temperature source Oral, SpO2 98 %, unknown if currently breastfeeding.  Gen. Pleasant, well-nourished, in no distress, normal affect   HEENT: [place]/AT, face symmetric, conjunctiva clear, no scleral icterus, PERRLA, EOMI, nares patent without drainage Lungs: no accessory muscle use Cardiovascular: RRR, no peripheral edema Musculoskeletal: No deformities, no cyanosis or clubbing, normal tone Neuro:   A&Ox3, CN II-XII intact, normal gait Skin:  Warm, no lesions/ rash   Wt Readings from Last 3 Encounters:  08/22/20 118 lb (53.5 kg)  06/05/20 117 lb (53.1 kg)  06/01/20 113 lb (51.3 kg)    Lab Results  Component Value Date   WBC 5.6 01/11/2020   HGB 14.1 01/11/2020   HCT 42.1 01/11/2020   PLT 247.0 01/11/2020   GLUCOSE 69 (L) 09/29/2019   CHOL 105 05/26/2019   TRIG 70 05/26/2019   HDL 61 05/26/2019   LDLCALC 30 05/26/2019   ALT 13 09/29/2019   AST 13 09/29/2019   NA 137 09/29/2019   K 3.9 09/29/2019   CL 103 09/29/2019   CREATININE 0.87 09/29/2019   BUN 9 09/29/2019   CO2 26 09/29/2019   TSH 1.31 09/29/2019    Assessment/Plan:  Acute vaginitis  -Given precautions -Abdomen self swab obtained -We will start Diflucan.  Additional recommendations based on results. -Given handout - Plan: POCT urinalysis dipstick, fluconazole (DIFLUCAN) 150 MG tablet, Cervicovaginal ancillary only  Dysuria  - Plan: POCT urinalysis dipstick, Urine Culture  Absent menses -UhCG negative - Plan: POCT urine pregnancy  Acute cystitis with hematuria -UA with 2+ leuks, protein, RBCs, negative nitrites -We will send for culture -Start Augmentin - Plan: Urine Culture, amoxicillin-clavulanate (AUGMENTIN) 500-125 MG tablet  F/u prn  Abbe Amsterdam, MD

## 2021-01-04 NOTE — Patient Instructions (Addendum)
Vaginitis  Vaginitis is irritation and swelling of the vagina. Treatment will depend on the cause. What are the causes? It can be caused by:  Bacteria.  Yeast.  A parasite.  A virus.  Low hormone levels.  Bubble baths, scented tampons, and feminine sprays. Other things can change the balance of the yeast and bacteria that live in the vagina. These include:  Antibiotic medicines.  Not being clean enough.  Some birth control methods.  Sex.  Infection.  Diabetes.  A weakened body defense system (immune system). What increases the risk?  Smoking or being around someone who smokes.  Using washes (douches), scented tampons, or scented pads.  Wearing tight pants or thong underwear.  Using birth control pills or an IUD.  Having sex without a condom or having a lot of partners.  Having an STI.  Using a certain product to kill sperm (nonoxynol-9).  Eating foods that are high in sugar.  Having diabetes.  Having low levels of a female hormone.  Having a weakened body defense system.  Being pregnant or breastfeeding. What are the signs or symptoms?  Fluid coming from the vagina that is not normal.  A bad smell.  Itching, pain, or swelling.  Pain with sex.  Pain or burning when you pee (urinate). Sometimes there are no symptoms. How is this treated? Treatment may include:  Antibiotic creams or pills.  Antifungal medicines.  Medicines to ease symptoms if you have a virus. Your sex partner should also be treated.  Estrogen medicines.  Avoiding scented soaps, sprays, or douches.  Stopping use of products that caused irritation and then using a cream to treat symptoms. Follow these instructions at home: Lifestyle  Keep the area around your vagina clean and dry. ? Avoid using soap. ? Rinse the area with water.  Until your doctor says it is okay: ? Do not use washes for the vagina. ? Do not use tampons. ? Do not have sex.  Wipe from front to  back after going to the bathroom.  When your doctor says it is okay, practice safe sex and use condoms. General instructions  Take over-the-counter and prescription medicines only as told by your doctor.  If you were prescribed an antibiotic medicine, take or use it as told by your doctor. Do not stop taking or using it even if you start to feel better.  Keep all follow-up visits. How is this prevented?  Do not use things that can irritate the vagina, such as fabric softeners. Avoid these products if they are scented: ? Sprays. ? Detergents. ? Tampons. ? Products for cleaning the vagina. ? Soaps or bubble baths.  Let air reach your vagina. To do this: ? Wear cotton underwear. ? Do not wear:  Underwear while you sleep.  Tight pants.  Thong underwear.  Underwear or nylons without a cotton panel. ? Take off any wet clothing, such as bathing suits, as soon as you can. ? Practice safe sex and use condoms. Contact a doctor if:  You have pain in your belly or in the area between your hips.  You have a fever or chills.  Your symptoms last for more than 2-3 days. Get help right away if:  You have a fever and your symptoms get worse all of a sudden. Summary  Vaginitis is irritation and swelling of the vagina.  Treatment will depend on the cause of the condition.  Do not use washes or tampons or have sex until your doctor says it   is okay. This information is not intended to replace advice given to you by your health care provider. Make sure you discuss any questions you have with your health care provider. Document Revised: 03/23/2020 Document Reviewed: 03/23/2020 Elsevier Patient Education  2021 Elsevier Inc.  Urinary Tract Infection, Adult A urinary tract infection (UTI) is an infection of any part of the urinary tract. The urinary tract includes:  The kidneys.  The ureters.  The bladder.  The urethra. These organs make, store, and get rid of pee (urine) in the  body. What are the causes? This infection is caused by germs (bacteria) in your genital area. These germs grow and cause swelling (inflammation) of your urinary tract. What increases the risk? The following factors may make you more likely to develop this condition:  Using a small, thin tube (catheter) to drain pee.  Not being able to control when you pee or poop (incontinence).  Being female. If you are female, these things can increase the risk: ? Using these methods to prevent pregnancy:  A medicine that kills sperm (spermicide).  A device that blocks sperm (diaphragm). ? Having low levels of a female hormone (estrogen). ? Being pregnant. You are more likely to develop this condition if:  You have genes that add to your risk.  You are sexually active.  You take antibiotic medicines.  You have trouble peeing because of: ? A prostate that is bigger than normal, if you are female. ? A blockage in the part of your body that drains pee from the bladder. ? A kidney stone. ? A nerve condition that affects your bladder. ? Not getting enough to drink. ? Not peeing often enough.  You have other conditions, such as: ? Diabetes. ? A weak disease-fighting system (immune system). ? Sickle cell disease. ? Gout. ? Injury of the spine. What are the signs or symptoms? Symptoms of this condition include:  Needing to pee right away.  Peeing small amounts often.  Pain or burning when peeing.  Blood in the pee.  Pee that smells bad or not like normal.  Trouble peeing.  Pee that is cloudy.  Fluid coming from the vagina, if you are female.  Pain in the belly or lower back. Other symptoms include:  Vomiting.  Not feeling hungry.  Feeling mixed up (confused). This may be the first symptom in older adults.  Being tired and grouchy (irritable).  A fever.  Watery poop (diarrhea). How is this treated?  Taking antibiotic medicine.  Taking other medicines.  Drinking  enough water. In some cases, you may need to see a specialist. Follow these instructions at home: Medicines  Take over-the-counter and prescription medicines only as told by your doctor.  If you were prescribed an antibiotic medicine, take it as told by your doctor. Do not stop taking it even if you start to feel better. General instructions  Make sure you: ? Pee until your bladder is empty. ? Do not hold pee for a long time. ? Empty your bladder after sex. ? Wipe from front to back after peeing or pooping if you are a female. Use each tissue one time when you wipe.  Drink enough fluid to keep your pee pale yellow.  Keep all follow-up visits.   Contact a doctor if:  You do not get better after 1-2 days.  Your symptoms go away and then come back. Get help right away if:  You have very bad back pain.  You have very bad pain  in your lower belly.  You have a fever.  You have chills.  You feeling like you will vomit or you vomit. Summary  A urinary tract infection (UTI) is an infection of any part of the urinary tract.  This condition is caused by germs in your genital area.  There are many risk factors for a UTI.  Treatment includes antibiotic medicines.  Drink enough fluid to keep your pee pale yellow. This information is not intended to replace advice given to you by your health care provider. Make sure you discuss any questions you have with your health care provider. Document Revised: 05/05/2020 Document Reviewed: 05/05/2020 Elsevier Patient Education  2021 [employer].

## 2021-01-05 LAB — CERVICOVAGINAL ANCILLARY ONLY
Bacterial Vaginitis (gardnerella): NEGATIVE
Candida Glabrata: NEGATIVE
Candida Vaginitis: POSITIVE — AB
Chlamydia: NEGATIVE
Comment: NEGATIVE
Comment: NEGATIVE
Comment: NEGATIVE
Comment: NEGATIVE
Comment: NEGATIVE
Comment: NORMAL
Neisseria Gonorrhea: NEGATIVE
Trichomonas: NEGATIVE

## 2021-01-05 NOTE — Progress Notes (Signed)
Results viewed on MyChart. 

## 2021-01-06 LAB — URINE CULTURE
MICRO NUMBER:: 11716704
SPECIMEN QUALITY:: ADEQUATE

## 2021-01-10 NOTE — Progress Notes (Signed)
Results viewed on MyChart. 

## 2021-03-21 ENCOUNTER — Other Ambulatory Visit: Payer: Self-pay

## 2021-03-22 ENCOUNTER — Ambulatory Visit: Payer: 59 | Admitting: Family Medicine

## 2021-03-22 DIAGNOSIS — Z0289 Encounter for other administrative examinations: Secondary | ICD-10-CM

## 2021-04-24 NOTE — Progress Notes (Signed)
Chief Complaint  Patient presents with   Medication Consultation    HPI: Janice Sutton 20 y.o. come in for with contraception.  She had been on Depo-Provera for a number of years and has been off 4 to 5 months because of a number of nonmedical reasons.  Her Depo would not be paid for at her GYN office.  LMP July 7 to 8 normal 1 partner condoms 99% of the time Had no periods on the Depo had 1 that began 2 months off. No vaginal symptoms but would like the urine test for STDs just in case. ROS: See pertinent positives and negatives per HPI.  Past Medical History:  Diagnosis Date   ADD (attention deficit disorder)    STD (sexually transmitted disease) 07/2016   Chlamydia     Family History  Problem Relation Age of Onset   Asthma Mother     Social History   Socioeconomic History   Marital status: Single    Spouse name: Not on file   Number of children: Not on file   Years of education: Not on file   Highest education level: Not on file  Occupational History   Not on file  Tobacco Use   Smoking status: Former    Types: Cigarettes    Quit date: 08/20/2019    Years since quitting: 1.6   Smokeless tobacco: Never  Vaping Use   Vaping Use: Never used  Substance and Sexual Activity   Alcohol use: No   Drug use: No   Sexual activity: Yes    Partners: Male    Birth control/protection: None  Other Topics Concern   Not on file  Social History Narrative   2 people living in the home. Mother and teen    pet yorkipoo   Moved from [place]   Father high school incarcerated    mother  Porchia Sinkler ;history of asthma; MBA accounting and finance   Negative ETS /FA   Social Determinants of Health   Financial Resource Strain: Not on file  Food Insecurity: Not on file  Transportation Needs: Not on file  Physical Activity: Not on file  Stress: Not on file  Social Connections: Not on file    Outpatient Medications Prior to Visit  Medication Sig Dispense  Refill   amoxicillin-clavulanate (AUGMENTIN) 500-125 MG tablet Take 1 tablet (500 mg total) by mouth in the morning and at bedtime. 14 tablet 0   betamethasone valerate ointment (VALISONE) 0.1 % Use a pea sized amount topically BID for up to one week as needed 15 g 0   medroxyPROGESTERone (DEPO-PROVERA) 150 MG/ML injection Inject 150 mg into the muscle every 3 (three) months.     No facility-administered medications prior to visit.     EXAM:  BP 106/70 (BP Location: Left Arm, Patient Position: Sitting, Cuff Size: Normal)   Pulse 83   Temp 98.3 F (36.8 C) (Oral)   Ht 5\' 2"  (1.575 m)   Wt 114 lb 3.2 oz (51.8 kg)   SpO2 97%   BMI 20.89 kg/m   Body mass index is 20.89 kg/m.  GENERAL: vitals reviewed and listed above, alert, oriented, appears well hydrated and in no acute distress HEENT: atraumatic, conjunctiva  clear, no obvious abnormalities on inspection of external nose and ears OP :LUNGS: clear to auscultation bilaterally, no wheezes, rales or rhonchi, good air movement CV:  no clubbing cyanosis or  peripheral edema nl cap refill  MS: moves all extremities without noticeable focal  abnormality PSYCH: pleasant and cooperative, no obvious depression or anxiety Lab Results  Component Value Date   WBC 5.6 01/11/2020   HGB 14.1 01/11/2020   HCT 42.1 01/11/2020   PLT 247.0 01/11/2020   GLUCOSE 69 (L) 09/29/2019   CHOL 105 05/26/2019   TRIG 70 05/26/2019   HDL 61 05/26/2019   LDLCALC 30 05/26/2019   ALT 13 09/29/2019   AST 13 09/29/2019   NA 137 09/29/2019   K 3.9 09/29/2019   CL 103 09/29/2019   CREATININE 0.87 09/29/2019   BUN 9 09/29/2019   CO2 26 09/29/2019   TSH 1.31 09/29/2019   BP Readings from Last 3 Encounters:  04/25/21 106/70  01/04/21 (!) 102/58  08/22/20 110/72    ASSESSMENT AND PLAN:  Discussed the following assessment and plan:  Depo-Provera contraceptive status - Plan: Urine cytology ancillary only, POCT urine pregnancy, medroxyPROGESTERone  (DEPO-PROVERA) injection 150 mg, Urine cytology ancillary only  Routine screening for STI (sexually transmitted infection) - Plan: Urine cytology ancillary only, POCT urine pregnancy, Urine cytology ancillary only Point-of-care pregnancy test negative low risk of pregnancy ,discussed with patient okay to begin Depo-Provera again and come back in 12 weeks next shot ,   follow-up visit with me in 6 to 12 months or as needed  -Patient advised to return or notify health care team  if  new concerns arise.  There are no Patient Instructions on file for this visit.   Neta Mends. Tray Klayman M.D.

## 2021-04-25 ENCOUNTER — Ambulatory Visit (INDEPENDENT_AMBULATORY_CARE_PROVIDER_SITE_OTHER): Payer: 59 | Admitting: Internal Medicine

## 2021-04-25 ENCOUNTER — Other Ambulatory Visit: Payer: Self-pay

## 2021-04-25 ENCOUNTER — Other Ambulatory Visit (HOSPITAL_COMMUNITY)
Admission: RE | Admit: 2021-04-25 | Discharge: 2021-04-25 | Disposition: A | Discharge status: Home / Self Care | Payer: 59 | Source: Ambulatory Visit | Attending: Internal Medicine | Admitting: Internal Medicine

## 2021-04-25 ENCOUNTER — Encounter: Payer: Self-pay | Admitting: Internal Medicine

## 2021-04-25 VITALS — BP 106/70 | HR 83 | Temp 98.3°F | Ht 62.0 in | Wt 114.2 lb

## 2021-04-25 DIAGNOSIS — Z113 Encounter for screening for infections with a predominantly sexual mode of transmission: Secondary | ICD-10-CM | POA: Diagnosis present

## 2021-04-25 DIAGNOSIS — Z3042 Encounter for surveillance of injectable contraceptive: Secondary | ICD-10-CM | POA: Diagnosis present

## 2021-04-25 MED ORDER — MEDROXYPROGESTERONE ACETATE 150 MG/ML IM SUSP
150.0000 mg | Freq: Once | INTRAMUSCULAR | Status: AC
  Administered 2021-04-25: 150 mg via INTRAMUSCULAR

## 2021-04-26 LAB — URINE CYTOLOGY ANCILLARY ONLY
Chlamydia: NEGATIVE
Comment: NEGATIVE
Comment: NEGATIVE
Comment: NORMAL
Neisseria Gonorrhea: NEGATIVE
Trichomonas: NEGATIVE

## 2021-04-29 NOTE — Progress Notes (Signed)
Negative STI screen

## 2021-08-23 ENCOUNTER — Other Ambulatory Visit (HOSPITAL_COMMUNITY)
Admission: RE | Admit: 2021-08-23 | Discharge: 2021-08-23 | Disposition: A | Discharge status: Home / Self Care | Payer: 59 | Source: Ambulatory Visit | Attending: Internal Medicine | Admitting: Internal Medicine

## 2021-08-23 ENCOUNTER — Ambulatory Visit (INDEPENDENT_AMBULATORY_CARE_PROVIDER_SITE_OTHER): Payer: 59

## 2021-08-23 ENCOUNTER — Ambulatory Visit: Payer: 59 | Admitting: Family Medicine

## 2021-08-23 DIAGNOSIS — R8290 Unspecified abnormal findings in urine: Secondary | ICD-10-CM

## 2021-08-23 DIAGNOSIS — Z309 Encounter for contraceptive management, unspecified: Secondary | ICD-10-CM | POA: Diagnosis not present

## 2021-08-23 LAB — POCT URINE PREGNANCY: Preg Test, Ur: NEGATIVE

## 2021-08-23 MED ORDER — MEDROXYPROGESTERONE ACETATE 150 MG/ML IM SUSP
150.0000 mg | Freq: Once | INTRAMUSCULAR | Status: AC
  Administered 2021-08-23: 12:00:00 150 mg via INTRAMUSCULAR

## 2021-08-23 NOTE — Progress Notes (Signed)
Per orders of Dr. Fabian Sharp, injection of Depo Provera given by Solon Augusta. Patient tolerated injection well.   While at he nurse visit the patient stated that she had notice a strong odor to her urine and asked to have her urine tested for GC/chlamydia. Per Dr. Fabian Sharp, ok to order labs. Urine sample has been sent to the lab for testing. Patient is aware that we will call her with results.

## 2021-08-24 LAB — URINE CYTOLOGY ANCILLARY ONLY
Chlamydia: NEGATIVE
Comment: NEGATIVE
Comment: NORMAL
Neisseria Gonorrhea: NEGATIVE

## 2021-08-26 NOTE — Progress Notes (Signed)
Urine gc chlamydia screen is negative .

## 2021-11-12 ENCOUNTER — Telehealth: Payer: Self-pay | Admitting: Internal Medicine

## 2021-11-12 NOTE — Telephone Encounter (Signed)
Pt had last depo on 08-23-2022 and would like to sch. Pt has an appt with dr [place] on 11-14-2021. Please advise

## 2021-11-12 NOTE — Progress Notes (Deleted)
ACUTE VISIT No chief complaint on file.  HPI: [name] is a 21 y.o. female, who is here today complaining of *** HPI  Review of Systems Rest see pertinent positives and negatives per HPI.  Current Outpatient Medications on File Prior to Visit  Medication Sig Dispense Refill   amoxicillin-clavulanate (AUGMENTIN) 500-125 MG tablet Take 1 tablet (500 mg total) by mouth in the morning and at bedtime. 14 tablet 0   betamethasone valerate ointment (VALISONE) 0.1 % Use a pea sized amount topically BID for up to one week as needed 15 g 0   medroxyPROGESTERone (DEPO-PROVERA) 150 MG/ML injection Inject 150 mg into the muscle every 3 (three) months.     No current facility-administered medications on file prior to visit.     Past Medical History:  Diagnosis Date   ADD (attention deficit disorder)    STD (sexually transmitted disease) 07/2016   Chlamydia    No Known Allergies  Social History   Socioeconomic History   Marital status: Single    Spouse name: Not on file   Number of children: Not on file   Years of education: Not on file   Highest education level: Some college, no degree  Occupational History   Not on file  Tobacco Use   Smoking status: Former    Types: Cigarettes    Quit date: 08/20/2019    Years since quitting: 2.2   Smokeless tobacco: Never  Vaping Use   Vaping Use: Never used  Substance and Sexual Activity   Alcohol use: No   Drug use: No   Sexual activity: Yes    Partners: Male    Birth control/protection: None  Other Topics Concern   Not on file  Social History Narrative   2 people living in the home. Mother and teen    pet yorkipoo   Moved from [place]   Father high school incarcerated    mother  Nance Mccombs ;history of asthma; MBA accounting and finance   Negative ETS /FA   Social Determinants of Health   Financial Resource Strain: Medium Risk   Difficulty of Paying Living Expenses: Somewhat hard  Food  Insecurity: Food Insecurity Present   Worried About Programme researcher, broadcasting/film/video in the Last Year: Sometimes true   Ran Out of Food in the Last Year: Sometimes true  Transportation Needs: No Transportation Needs   Lack of Transportation (Medical): No   Lack of Transportation (Non-Medical): No  Physical Activity: Insufficiently Active   Days of Exercise per Week: 7 days   Minutes of Exercise per Session: 10 min  Stress: Stress Concern Present   Feeling of Stress : To some extent  Social Connections: Moderately Isolated   Frequency of Communication with Friends and Family: More than three times a week   Frequency of Social Gatherings with Friends and Family: Twice a week   Attends Religious Services: More than 4 times per year   Active Member of [employer] or Organizations: No   Attends Engineer, structural: Not on file   Marital Status: Never married    There were no vitals filed for this visit. There is no height or weight on file to calculate BMI.  Physical Exam  ASSESSMENT AND PLAN:  There are no diagnoses linked to this encounter.   No follow-ups on file.   Betty G. [place], MD  [facility] [place] office.  Discharge Instructions   None

## 2021-11-13 NOTE — Telephone Encounter (Signed)
Noted pt has been scheduled and injection will be discussed with Dr. Martinique.

## 2021-11-13 NOTE — Telephone Encounter (Addendum)
Pt went to [facility] and does not need appt now. Pt would like to know if she can have TB test for her new  job also depo Please advise

## 2021-11-13 NOTE — Telephone Encounter (Signed)
Okay to do TB test (PPD )and Depo shot when due  can do this as a nurse visit.  Needs yearly visit in person  in   the summer (last time in July.)

## 2021-11-14 ENCOUNTER — Ambulatory Visit: Payer: 59 | Admitting: Family Medicine

## 2021-11-14 NOTE — Telephone Encounter (Signed)
Pt has been scheduled for NV. Pt stated she would call back to schedule CPE.

## 2021-11-15 ENCOUNTER — Ambulatory Visit (INDEPENDENT_AMBULATORY_CARE_PROVIDER_SITE_OTHER): Payer: 59 | Admitting: *Deleted

## 2021-11-15 DIAGNOSIS — Z3042 Encounter for surveillance of injectable contraceptive: Secondary | ICD-10-CM | POA: Diagnosis not present

## 2021-11-15 DIAGNOSIS — Z309 Encounter for contraceptive management, unspecified: Secondary | ICD-10-CM

## 2021-11-15 MED ORDER — MEDROXYPROGESTERONE ACETATE 150 MG/ML IM SUSP
150.0000 mg | Freq: Once | INTRAMUSCULAR | Status: AC
  Administered 2021-11-15: 150 mg via INTRAMUSCULAR

## 2021-11-15 NOTE — Progress Notes (Signed)
Per orders of Dr. Ardyth Harps, injection of Medroxyprogesterone acetate injectable 150mg  was given by . Patient tolerated injection well.

## 2021-11-20 ENCOUNTER — Ambulatory Visit (INDEPENDENT_AMBULATORY_CARE_PROVIDER_SITE_OTHER): Payer: 59

## 2021-11-20 DIAGNOSIS — Z111 Encounter for screening for respiratory tuberculosis: Secondary | ICD-10-CM | POA: Diagnosis not present

## 2021-11-22 ENCOUNTER — Ambulatory Visit: Payer: 59

## 2021-11-22 DIAGNOSIS — Z111 Encounter for screening for respiratory tuberculosis: Secondary | ICD-10-CM

## 2021-11-22 LAB — TB SKIN TEST
Induration: 0 mm
TB Skin Test: NEGATIVE

## 2021-11-22 NOTE — Progress Notes (Signed)
PPD Reading Note  PPD read and results entered in EpicCare.  Result: 0 mm induration.  Interpretation: Negative  If test not read within 48-72 hours of initial placement, patient advised to repeat in other arm 1-3 weeks after this test.  Allergic reaction: no

## 2022-03-22 ENCOUNTER — Ambulatory Visit (INDEPENDENT_AMBULATORY_CARE_PROVIDER_SITE_OTHER): Payer: 59 | Admitting: *Deleted

## 2022-03-22 ENCOUNTER — Ambulatory Visit: Payer: 59 | Admitting: Family

## 2022-03-22 DIAGNOSIS — Z3042 Encounter for surveillance of injectable contraceptive: Secondary | ICD-10-CM | POA: Diagnosis not present

## 2022-03-22 DIAGNOSIS — Z3200 Encounter for pregnancy test, result unknown: Secondary | ICD-10-CM

## 2022-03-22 LAB — POCT URINE PREGNANCY: Preg Test, Ur: NEGATIVE

## 2022-03-22 MED ORDER — MEDROXYPROGESTERONE ACETATE 150 MG/ML IM SUSP
150.0000 mg | Freq: Once | INTRAMUSCULAR | Status: AC
  Administered 2022-03-22: 150 mg via INTRAMUSCULAR

## 2022-03-22 NOTE — Progress Notes (Signed)
Per orders of Dr. Clent Ridges, injection of Medroxyprogesterone Acetate injectable suspension 150mg  given by . Patient tolerated injection well.  Pregnancy test initially performed with negative results due to patient being late for the injection (last injection 11/15/2021).

## 2022-04-02 ENCOUNTER — Ambulatory Visit: Payer: 59 | Admitting: Family Medicine

## 2022-04-25 ENCOUNTER — Telehealth: Payer: Self-pay | Admitting: Internal Medicine

## 2022-04-25 NOTE — Telephone Encounter (Signed)
Last Ov 04/25/21 Please advise

## 2022-04-25 NOTE — Telephone Encounter (Signed)
Pt needs a letter confirming that she has been diagnosed with tonsillitis and is prone to strep throat for her job. Requesting call with confirmation.

## 2022-04-26 NOTE — Telephone Encounter (Signed)
No enough information I dont see a recent dx  . she has had tonsillitis in past but not sure  what is actually required  . If needs assessment   on going can make a visit virtual or other

## 2022-04-26 NOTE — Telephone Encounter (Signed)
Pt scheduled CPE since last OV 04/25/21.

## 2022-04-29 ENCOUNTER — Other Ambulatory Visit (HOSPITAL_COMMUNITY)
Admission: RE | Admit: 2022-04-29 | Discharge: 2022-04-29 | Disposition: A | Discharge status: Home / Self Care | Payer: 59 | Source: Ambulatory Visit | Attending: Internal Medicine | Admitting: Internal Medicine

## 2022-04-29 ENCOUNTER — Encounter: Payer: Self-pay | Admitting: Internal Medicine

## 2022-04-29 ENCOUNTER — Ambulatory Visit (INDEPENDENT_AMBULATORY_CARE_PROVIDER_SITE_OTHER): Payer: 59 | Admitting: Internal Medicine

## 2022-04-29 VITALS — BP 106/64 | HR 95 | Temp 98.6°F | Ht 63.0 in | Wt 123.8 lb

## 2022-04-29 DIAGNOSIS — G4720 Circadian rhythm sleep disorder, unspecified type: Secondary | ICD-10-CM | POA: Diagnosis not present

## 2022-04-29 DIAGNOSIS — Z0184 Encounter for antibody response examination: Secondary | ICD-10-CM

## 2022-04-29 DIAGNOSIS — R5383 Other fatigue: Secondary | ICD-10-CM | POA: Diagnosis not present

## 2022-04-29 DIAGNOSIS — Z8709 Personal history of other diseases of the respiratory system: Secondary | ICD-10-CM

## 2022-04-29 DIAGNOSIS — Z0000 Encounter for general adult medical examination without abnormal findings: Secondary | ICD-10-CM

## 2022-04-29 DIAGNOSIS — N898 Other specified noninflammatory disorders of vagina: Secondary | ICD-10-CM | POA: Diagnosis not present
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
## 2022-04-29 NOTE — Patient Instructions (Signed)
Checking lab for fatigue Need to get in with ENT about recurrent sore throats  looks ok today .  Agree with seeing a counselor about stress I suspect  your sleep is off causing tiredness with inadequate sleep and also stress.  Calendar your sleep and  if needed fu in 3 months .  Send message about immunization requirements and will go from there.

## 2022-04-30 LAB — CBC WITH DIFFERENTIAL/PLATELET
Basophils Absolute: 0.1 10*3/uL (ref 0.0–0.1)
Basophils Relative: 1.1 % (ref 0.0–3.0)
Eosinophils Absolute: 0.1 10*3/uL (ref 0.0–0.7)
Eosinophils Relative: 2.3 % (ref 0.0–5.0)
HCT: 41.5 % (ref 36.0–46.0)
Hemoglobin: 14 g/dL (ref 12.0–15.0)
Lymphocytes Relative: 37 % (ref 12.0–46.0)
Lymphs Abs: 2.3 10*3/uL (ref 0.7–4.0)
MCHC: 33.7 g/dL (ref 30.0–36.0)
MCV: 97.2 fl (ref 78.0–100.0)
Monocytes Absolute: 0.6 10*3/uL (ref 0.1–1.0)
Monocytes Relative: 9.9 % (ref 3.0–12.0)
Neutro Abs: 3.1 10*3/uL (ref 1.4–7.7)
Neutrophils Relative %: 49.7 % (ref 43.0–77.0)
Platelets: 276 10*3/uL (ref 150.0–400.0)
RBC: 4.28 Mil/uL (ref 3.87–5.11)
RDW: 13.3 % (ref 11.5–15.5)
WBC: 6.2 10*3/uL (ref 4.0–10.5)

## 2022-04-30 LAB — COMPREHENSIVE METABOLIC PANEL
ALT: 13 U/L (ref 0–35)
AST: 19 U/L (ref 0–37)
Albumin: 4.9 g/dL (ref 3.5–5.2)
Alkaline Phosphatase: 50 U/L (ref 39–117)
BUN: 13 mg/dL (ref 6–23)
CO2: 24 mEq/L (ref 19–32)
Calcium: 9.9 mg/dL (ref 8.4–10.5)
Chloride: 106 mEq/L (ref 96–112)
Creatinine, Ser: 0.96 mg/dL (ref 0.40–1.20)
GFR: 83.53 mL/min (ref >60.00)
Glucose, Bld: 82 mg/dL (ref 70–99)
Potassium: 3.9 mEq/L (ref 3.5–5.1)
Sodium: 139 mEq/L (ref 135–145)
Total Bilirubin: 0.5 mg/dL (ref 0.2–1.2)
Total Protein: 7.7 g/dL (ref 6.0–8.3)

## 2022-04-30 LAB — TSH: TSH: 0.87 u[IU]/mL (ref 0.35–5.50)

## 2022-04-30 LAB — VARICELLA ZOSTER ANTIBODY, IGG: Varicella IgG: 548.5 index

## 2022-04-30 LAB — T4, FREE: Free T4: 0.75 ng/dL (ref 0.60–1.60)

## 2022-05-01 LAB — CERVICOVAGINAL ANCILLARY ONLY
Bacterial Vaginitis (gardnerella): POSITIVE — AB
Candida Glabrata: NEGATIVE
Candida Vaginitis: POSITIVE — AB
Comment: NEGATIVE
Comment: NEGATIVE
Comment: NEGATIVE
Comment: NEGATIVE
Trichomonas: NEGATIVE

## 2022-05-02 ENCOUNTER — Telehealth: Payer: Self-pay | Admitting: Internal Medicine

## 2022-05-02 ENCOUNTER — Other Ambulatory Visit: Payer: Self-pay

## 2022-05-02 MED ORDER — METRONIDAZOLE 500 MG PO TABS: 500.0000 mg | ORAL_TABLET | Freq: Two times a day (BID) | ORAL | 0 refills | Status: AC

## 2022-05-02 MED ORDER — FLUCONAZOLE 150 MG PO TABS: 150.0000 mg | ORAL_TABLET | Freq: Once | ORAL | 0 refills | Status: AC

## 2022-05-02 NOTE — Progress Notes (Signed)
Blood work is good   she is immune to chicken pox . Vaginal swab showed yeast and BV     FOr years  Can use otc monistat   for 3-7 days or if prefers can send in diflucan 150 mgDisp 1  take one For the BV  please send in  metronidazole 500 mg 1 po bid for 7 days disp 14  . If not allergic to this med

## 2022-05-02 NOTE — Telephone Encounter (Signed)
Please see result note and send in meds

## 2022-05-02 NOTE — Telephone Encounter (Signed)
Pt checking on progress of prescription to treat her BV. Last OV 04/29/22   [id number] - [place],  - [address] AT [facility] OF [address] Phone:  [phone]  Fax:  [phone]

## 2022-05-02 NOTE — Telephone Encounter (Signed)
error 

## 2022-05-02 NOTE — Telephone Encounter (Signed)
Please advise 

## 2022-05-02 NOTE — Telephone Encounter (Signed)
See results note. 

## 2022-05-02 NOTE — Telephone Encounter (Signed)
Pt states the pharmacy says the fluconazole (DIFLUCAN) 150 MG tablet is on backorder but they do have a different strength (200mg ) in stock or to substitute something else.

## 2022-05-06 MED ORDER — FLUCONAZOLE 200 MG PO TABS: 200.0000 mg | ORAL_TABLET | Freq: Once | ORAL | 0 refills | Status: AC

## 2022-05-06 NOTE — Telephone Encounter (Signed)
send in diflucan 200 mg  take one po x 1  disp 1

## 2022-05-06 NOTE — Telephone Encounter (Signed)
New prescription sent to pharmacy 

## 2022-05-06 NOTE — Addendum Note (Signed)
Addended by: Jerrell Belfast on: 05/06/2022 09:28 AM   Modules accepted: Orders

## 2022-05-16 ENCOUNTER — Telehealth: Payer: Self-pay | Admitting: Internal Medicine

## 2022-05-16 NOTE — Telephone Encounter (Signed)
Pt called to request a record of her immunizations.  Pt was told to come in to the office and sign form to request records.  And, she also needs confirmation on whether  she took the following shots already:  TB  TDAP MMR Hep B  She needs them for school. Please advise [phone]

## 2022-05-17 NOTE — Telephone Encounter (Signed)
Left a voice message for patient to call us back.

## 2022-05-20 NOTE — Telephone Encounter (Signed)
Patient return a call and spoke to patient on 8/10. She states she will contact grandmother in regards to her immunization that she had in [place]. Patient said she would give [place] a call back.

## 2022-05-23 ENCOUNTER — Telehealth: Payer: Self-pay

## 2022-05-23 ENCOUNTER — Ambulatory Visit (INDEPENDENT_AMBULATORY_CARE_PROVIDER_SITE_OTHER): Payer: 59 | Admitting: Family Medicine

## 2022-05-23 ENCOUNTER — Encounter: Payer: Self-pay | Admitting: Family Medicine

## 2022-05-23 ENCOUNTER — Other Ambulatory Visit: Payer: Self-pay

## 2022-05-23 VITALS — BP 98/80 | HR 67 | Temp 98.4°F | Wt 121.8 lb

## 2022-05-23 DIAGNOSIS — R300 Dysuria: Secondary | ICD-10-CM

## 2022-05-23 DIAGNOSIS — N3001 Acute cystitis with hematuria: Secondary | ICD-10-CM

## 2022-05-23 LAB — POCT URINALYSIS DIPSTICK
Glucose, UA: NEGATIVE
Ketones, UA: NEGATIVE
Protein, UA: POSITIVE — AB
Spec Grav, UA: 1.02 (ref 1.010–1.025)
Urobilinogen, UA: 0.2 E.U./dL
pH, UA: 7 (ref 5.0–8.0)

## 2022-05-23 MED ORDER — AMOXICILLIN 500 MG PO TABS: 500.0000 mg | ORAL_TABLET | Freq: Two times a day (BID) | ORAL | 0 refills | Status: AC

## 2022-05-23 MED ORDER — PHENAZOPYRIDINE HCL 100 MG PO TABS: 100.0000 mg | ORAL_TABLET | Freq: Two times a day (BID) | ORAL | 0 refills | Status: AC | PRN

## 2022-05-23 NOTE — Progress Notes (Signed)
Subjective:    Patient ID: Janice Sutton, female    DOB: 04/13/2001, 21 y.o.   MRN: [id number]  Chief Complaint  Patient presents with   Dysuria    More of a stinging feeling, started yday. Got up and started bleeding, is on OCP.pain in lower abdomen and low back. Stinging feeling, Is like a lasting feeling only while urinating.     HPI Patient was seen today for acute concern.  Patient followed by Dr. Fabian Sharp.  Pt with dysuria, hematuria, suprapubic pain, bilateral low back pain and flank pain starting yesterday.  Hematuria has increased, was on toilet tissue, now in urine.   Pt notes a stinging sensation with urinating.  Patient endorses recently changing body washes and holding urine at night.  Patient denies fever, chills, nausea or vomiting.  Trying to increase intake of water and fluids.  Started adding lemon to water and taking cranberry pills.  LMP 2021 as patient is on Depo Provera injections.  Past Medical History:  Diagnosis Date   ADD (attention deficit disorder)    STD (sexually transmitted disease) 07/2016   Chlamydia     No Known Allergies  ROS General: Denies fever, chills, night sweats, changes in weight, changes in appetite HEENT: Denies headaches, ear pain, changes in vision, rhinorrhea, sore throat CV: Denies CP, palpitations, SOB, orthopnea Pulm: Denies SOB, cough, wheezing GI: Denies abdominal pain, nausea, vomiting, diarrhea, constipation + suprapubic pain/lower flank pain GU: Denies vaginal discharge  +dysuria, hematuria, frequency Msk: Denies muscle cramps, joint pains  + bilateral low back pain Neuro: Denies weakness, numbness, tingling Skin: Denies rashes, bruising Psych: Denies depression, anxiety, hallucinations      Objective:    Blood pressure 98/80, pulse 67, temperature 98.4 F (36.9 C), temperature source Oral, weight 121 lb 12.8 oz (55.2 kg), SpO2 90 %, not currently breastfeeding.  Gen. Pleasant, well-nourished, in no distress, normal affect    HEENT: /AT, face symmetric, conjunctiva clear, no scleral icterus, PERRLA, EOMI, nares patent without drainage Lungs: no accessory muscle use, CTAB, no wheezes or rales Cardiovascular: RRR, no m/r/g, no peripheral edema Abdomen: BS present, soft, suprapubic tenderness, no hepatosplenomegaly.  No CVA tenderness. Neuro:  A&Ox3, CN II-XII intact, normal gait Skin:  Warm, no lesions/ rash   Wt Readings from Last 3 Encounters:  05/23/22 121 lb 12.8 oz (55.2 kg)  04/29/22 123 lb 12.8 oz (56.2 kg)  04/25/21 114 lb 3.2 oz (51.8 kg)    Lab Results  Component Value Date   WBC 6.2 04/29/2022   HGB 14.0 04/29/2022   HCT 41.5 04/29/2022   PLT 276.0 04/29/2022   GLUCOSE 82 04/29/2022   CHOL 105 05/26/2019   TRIG 70 05/26/2019   HDL 61 05/26/2019   LDLCALC 30 05/26/2019   ALT 13 04/29/2022   AST 19 04/29/2022   NA 139 04/29/2022   K 3.9 04/29/2022   CL 106 04/29/2022   CREATININE 0.96 04/29/2022   BUN 13 04/29/2022   CO2 24 04/29/2022   TSH 0.87 04/29/2022    Assessment/Plan:  Acute cystitis with hematuria  - Plan: amoxicillin (AMOXIL) 500 MG tablet, phenazopyridine (PYRIDIUM) 100 MG tablet  Dysuria - Plan: Urine Culture, POCT urinalysis dipstick, phenazopyridine (PYRIDIUM) 100 MG tablet  UA with moderate leuks, protein, 3+ RBC, bilirubin, nitrites.  Will obtain UCx.  Start amoxicillin while awaiting culture results.  We will also start Pyridium x2 days for discomfort.  Urine hCG negative.  Patient encouraged to increase p.o. intake of water and fluids.  Patient to notify clinic if develops pain in back that moves as may have renal calculi.  Given handout.  Given strict precautions.  F/u as needed  Abbe Amsterdam, MD

## 2022-05-23 NOTE — Patient Instructions (Signed)
A prescription for amoxicillin, an antibiotic for urinary tract infections was sent to the pharmacy. Take 1 pill twice a day for the next 7 days.  If the antibiotic needs to be changed based on the culture results we will send in a different prescription.  A prescription for Pyridium was also sent to your pharmacy.  We can take this over the next 2 days to help with discomfort while urinating.  For continued or worsening symptoms follow-up in clinic or at ED.

## 2022-05-23 NOTE — Telephone Encounter (Signed)
Caller states she has been on birth control for two years. Today she noticed vaginal bleeding and she would like to speak with someone about this.  05/22/2022 1:55:27 PM FINAL ATTEMPT MADE - message left [place], RN, Nash Dimmer  05/23/22 1144 - Pt states she figured out she has a UTI. Dyuria, denies hematuria. Pt states she taking cranberry pill & hydrating. Pt states she taking abx a week ago from Dr Fabian Sharp.Pt advised that she should schedule appt with in office provider to discuss symptoms & leave a urine sample. Appt scheduled with Dr Salomon Fick for today at 2p

## 2022-05-26 LAB — URINE CULTURE
MICRO NUMBER:: 13794137
SPECIMEN QUALITY:: ADEQUATE

## 2022-05-27 NOTE — Telephone Encounter (Signed)
Pt can not locate her immunization in [place] the place has closed down. Pt would like to have hep b declination. Pt will be starting college soon

## 2022-05-27 NOTE — Telephone Encounter (Signed)
Can patient do hep b antibody surface?  Please advise!

## 2022-05-27 NOTE — Telephone Encounter (Signed)
Ok to do hep b surface aby quantitative   titer  and hep b antigen  if patient wishes to show immunity  . Do not know if that will help in her declination.

## 2022-05-30 ENCOUNTER — Telehealth: Payer: Self-pay

## 2022-05-30 NOTE — Telephone Encounter (Signed)
Attempt to reach pt. See note.

## 2022-05-30 NOTE — Telephone Encounter (Signed)
Attempt to follow up with patient in regards to hep b. Left a voicemail to call back.

## 2022-06-11 NOTE — Telephone Encounter (Signed)
Attempt to reach the pharmacy. No answer. Unable to leave a voicemail.

## 2022-06-11 NOTE — Telephone Encounter (Signed)
Contacted patient. She updates that she had Hep B and MMR vaccine at [facility] [place] 2-3 wks ago. Inform her it is not in her chart yet. Usually the pharmacy fax over the record. Inform patient we can have the pharmacy to fax it over. No further question.

## 2023-01-01 ENCOUNTER — Ambulatory Visit (INDEPENDENT_AMBULATORY_CARE_PROVIDER_SITE_OTHER): Payer: 59 | Admitting: Family Medicine

## 2023-01-01 ENCOUNTER — Encounter: Payer: Self-pay | Admitting: Family Medicine

## 2023-01-01 ENCOUNTER — Other Ambulatory Visit (HOSPITAL_COMMUNITY)
Admission: RE | Admit: 2023-01-01 | Discharge: 2023-01-01 | Disposition: A | Discharge status: Home / Self Care | Payer: 59 | Source: Ambulatory Visit | Attending: Family Medicine | Admitting: Family Medicine

## 2023-01-01 VITALS — BP 104/70 | Temp 98.5°F | Ht 63.0 in | Wt 118.8 lb

## 2023-01-01 DIAGNOSIS — N760 Acute vaginitis: Secondary | ICD-10-CM

## 2023-01-01 DIAGNOSIS — N912 Amenorrhea, unspecified: Secondary | ICD-10-CM | POA: Diagnosis not present

## 2023-01-01 LAB — POCT URINE PREGNANCY: Preg Test, Ur: NEGATIVE

## 2023-01-01 MED ORDER — METRONIDAZOLE 500 MG PO TABS: 500.0000 mg | ORAL_TABLET | Freq: Two times a day (BID) | ORAL | 0 refills | Status: DC

## 2023-01-01 MED ORDER — METRONIDAZOLE 500 MG PO TABS: 500.0000 mg | ORAL_TABLET | Freq: Two times a day (BID) | ORAL | 0 refills | Status: AC

## 2023-01-01 NOTE — Patient Instructions (Signed)
The pregnancy test was negative.  A prescription for Flagyl was sent to your pharmacy.  This medication helps treat bacterial vaginosis and trichomonas.  You can start this while we are waiting on the results of the swab.  While taking the medication avoid drinking alcohol and having sex.  You may notice a metallic taste in your mouth from the medicine.

## 2023-01-01 NOTE — Progress Notes (Signed)
   Established Patient Office Visit   Subjective  Patient ID: Janice Sutton, female    DOB: [birth year]  Age: 22 y.o. MRN: [id number]  Chief Complaint  Patient presents with   Vaginitis    X2 weeks    Pt is a 22 yo female folllowed by Dr. Regis Bill and seen for acute concern.  Pt with vaginal d/c and odor x 2-3 wks.  Pt denies itch or irritation.  Pt tried OTC 3 d monistat and Boric acid tabs which seemed to make things worse.  D/c was initially clumpy and white, now thin.  Pt denies changes in soaps, lotions or detergents.  Endorses unprotected sex in mid February and a few days ago.  Patient notes history of irregular menses.  LMP 2021.  Previously on Depo.  Not currently on birth control.      ROS Negative unless stated above    Objective:     BP 104/70 (BP Location: Left Arm, Patient Position: Sitting, Cuff Size: Normal)   Temp 98.5 F (36.9 C) (Oral)   Ht 5\' 3"  (1.6 m)   Wt 118 lb 12.8 oz (53.9 kg)   BMI 21.04 kg/m    Physical Exam Constitutional:      General: She is not in acute distress.    Appearance: Normal appearance.  HENT:     Head: Normocephalic and atraumatic.     Nose: Nose normal.     Mouth/Throat:     Mouth: Mucous membranes are moist.  Cardiovascular:     Rate and Rhythm: Normal rate.     Heart sounds: No murmur heard.    No gallop.  Pulmonary:     Effort: Pulmonary effort is normal. No respiratory distress.     Breath sounds: No wheezing, rhonchi or rales.  Genitourinary:    Comments: Aptima self swab collected. Skin:    General: Skin is warm and dry.  Neurological:     Mental Status: She is alert and oriented to person, place, and time.      No results found for any visits on 01/01/23.    Assessment & Plan:  Acute vaginitis -     Cervicovaginal ancillary only  Absent menses -     POCT urine pregnancy  POC hCG negative.  Aptima self swab collected.  Will start flagyl for acute vaginitis while awaiting results from Aptima swab.   Given precautions.  Return if symptoms worsen or fail to improve.   Billie Ruddy, MD

## 2023-01-01 NOTE — Addendum Note (Signed)
Addended by: Nilda Riggs on: 01/01/2023 08:57 AM   Modules accepted: Orders

## 2023-01-02 LAB — CERVICOVAGINAL ANCILLARY ONLY
Bacterial Vaginitis (gardnerella): POSITIVE — AB
Candida Glabrata: NEGATIVE
Candida Vaginitis: NEGATIVE
Chlamydia: NEGATIVE
Comment: NEGATIVE
Comment: NEGATIVE
Comment: NEGATIVE
Comment: NEGATIVE
Comment: NEGATIVE
Comment: NORMAL
Neisseria Gonorrhea: NEGATIVE
Trichomonas: NEGATIVE

## 2023-03-26 ENCOUNTER — Ambulatory Visit: Payer: 59 | Admitting: Internal Medicine

## 2023-03-26 NOTE — Progress Notes (Deleted)
No chief complaint on file.   HPI: Janice Sutton 22 y.o. come in for  ROS: See pertinent positives and negatives per HPI.  Past Medical History:  Diagnosis Date   ADD (attention deficit disorder)    STD (sexually transmitted disease) 07/2016   Chlamydia     Family History  Problem Relation Age of Onset   Asthma Mother     Social History   Socioeconomic History   Marital status: Single    Spouse name: Not on file   Number of children: Not on file   Years of education: Not on file   Highest education level: Some college, no degree  Occupational History   Not on file  Tobacco Use   Smoking status: Former    Types: Cigarettes    Quit date: 08/20/2019    Years since quitting: 3.6   Smokeless tobacco: Never  Vaping Use   Vaping Use: Never used  Substance and Sexual Activity   Alcohol use: No   Drug use: No   Sexual activity: Yes    Partners: Male    Birth control/protection: None  Other Topics Concern   Not on file  Social History Narrative   2 people living in the home. Mother and teen    pet yorkipoo   Moved from [place]   Father high school incarcerated    mother  Janice Sutton ;history of asthma; MBA accounting and finance   Negative ETS /FA   Social Determinants of Health   Financial Resource Strain: Medium Risk (08/22/2021)   Overall Financial Resource Strain (CARDIA)    Difficulty of Paying Living Expenses: Somewhat hard  Food Insecurity: Food Insecurity Present (08/22/2021)   Hunger Vital Sign    Worried About Running Out of Food in the Last Year: Sometimes true    Ran Out of Food in the Last Year: Sometimes true  Transportation Needs: No Transportation Needs (08/22/2021)   PRAPARE - Administrator, Civil Service (Medical): No    Lack of Transportation (Non-Medical): No  Physical Activity: Insufficiently Active (08/22/2021)   Exercise Vital Sign    Days of Exercise per Week: 7 days    Minutes of Exercise per  Session: 10 min  Stress: Stress Concern Present (08/22/2021)   [employer] of Occupational Health - Occupational Stress Questionnaire    Feeling of Stress : To some extent  Social Connections: Moderately Isolated (08/22/2021)   Social Connection and Isolation Panel [NHANES]    Frequency of Communication with Friends and Family: More than three times a week    Frequency of Social Gatherings with Friends and Family: Twice a week    Attends Religious Services: More than 4 times per year    Active Member of [employer] or Organizations: No    Attends Engineer, structural: Not on file    Marital Status: Never married    Outpatient Medications Prior to Visit  Medication Sig Dispense Refill   betamethasone valerate ointment (VALISONE) 0.1 % Use a pea sized amount topically BID for up to one week as needed 15 g 0   medroxyPROGESTERone (DEPO-PROVERA) 150 MG/ML injection Inject 150 mg into the muscle every 3 (three) months.     No facility-administered medications prior to visit.     EXAM:  There were no vitals taken for this visit.  There is no height or weight on file to calculate BMI.  GENERAL: vitals reviewed and listed above, alert, oriented, appears well hydrated and in no  acute distress HEENT: atraumatic, conjunctiva  clear, no obvious abnormalities on inspection of external nose and ears OP : no lesion edema or exudate  NECK: no obvious masses on inspection palpation  LUNGS: clear to auscultation bilaterally, no wheezes, rales or rhonchi, good air movement CV: HRRR, no clubbing cyanosis or  peripheral edema nl cap refill  MS: moves all extremities without noticeable focal  abnormality PSYCH: pleasant and cooperative, no obvious depression or anxiety Lab Results  Component Value Date   WBC 6.2 04/29/2022   HGB 14.0 04/29/2022   HCT 41.5 04/29/2022   PLT 276.0 04/29/2022   GLUCOSE 82 04/29/2022   CHOL 105 05/26/2019   TRIG 70 05/26/2019   HDL 61 05/26/2019   LDLCALC  30 05/26/2019   ALT 13 04/29/2022   AST 19 04/29/2022   NA 139 04/29/2022   K 3.9 04/29/2022   CL 106 04/29/2022   CREATININE 0.96 04/29/2022   BUN 13 04/29/2022   CO2 24 04/29/2022   TSH 0.87 04/29/2022   BP Readings from Last 3 Encounters:  01/01/23 104/70  05/23/22 98/80  04/29/22 106/64    ASSESSMENT AND PLAN:  Discussed the following assessment and plan:  No diagnosis found.  -Patient advised to return or notify health care team  if  new concerns arise.  There are no Patient Instructions on file for this visit.   Neta Mends. Syeda Prickett M.D.

## 2023-04-01 ENCOUNTER — Ambulatory Visit: Payer: 59 | Admitting: Internal Medicine

## 2023-04-01 NOTE — Progress Notes (Deleted)
No chief complaint on file.   HPI: Janice Sutton 22 y.o. come in for missed appt from last week.   ?  ROS: See pertinent positives and negatives per HPI.  Past Medical History:  Diagnosis Date   ADD (attention deficit disorder)    STD (sexually transmitted disease) 07/2016   Chlamydia     Family History  Problem Relation Age of Onset   Asthma Mother     Social History   Socioeconomic History   Marital status: Single    Spouse name: Not on file   Number of children: Not on file   Years of education: Not on file   Highest education level: Some college, no degree  Occupational History   Not on file  Tobacco Use   Smoking status: Former    Types: Cigarettes    Quit date: 08/20/2019    Years since quitting: 3.6   Smokeless tobacco: Never  Vaping Use   Vaping Use: Never used  Substance and Sexual Activity   Alcohol use: No   Drug use: No   Sexual activity: Yes    Partners: Male    Birth control/protection: None  Other Topics Concern   Not on file  Social History Narrative   2 people living in the home. Mother and teen    pet yorkipoo   Moved from [place]   Father high school incarcerated    mother  Coda Filler ;history of asthma; MBA accounting and finance   Negative ETS /FA   Social Determinants of Health   Financial Resource Strain: Medium Risk (03/27/2023)   Overall Financial Resource Strain (CARDIA)    Difficulty of Paying Living Expenses: Somewhat hard  Food Insecurity: No Food Insecurity (03/27/2023)   Hunger Vital Sign    Worried About Running Out of Food in the Last Year: Never true    Ran Out of Food in the Last Year: Never true  Transportation Needs: No Transportation Needs (03/27/2023)   PRAPARE - Administrator, Civil Service (Medical): No    Lack of Transportation (Non-Medical): No  Physical Activity: Sufficiently Active (03/27/2023)   Exercise Vital Sign    Days of Exercise per Week: 7 days    Minutes of  Exercise per Session: 30 min  Stress: Stress Concern Present (03/27/2023)   [employer] of Occupational Health - Occupational Stress Questionnaire    Feeling of Stress : Very much  Social Connections: Moderately Isolated (03/27/2023)   Social Connection and Isolation Panel [NHANES]    Frequency of Communication with Friends and Family: More than three times a week    Frequency of Social Gatherings with Friends and Family: Twice a week    Attends Religious Services: More than 4 times per year    Active Member of [employer] or Organizations: No    Attends Engineer, structural: Not on file    Marital Status: Never married    Outpatient Medications Prior to Visit  Medication Sig Dispense Refill   betamethasone valerate ointment (VALISONE) 0.1 % Use a pea sized amount topically BID for up to one week as needed 15 g 0   medroxyPROGESTERone (DEPO-PROVERA) 150 MG/ML injection Inject 150 mg into the muscle every 3 (three) months.     No facility-administered medications prior to visit.     EXAM:  There were no vitals taken for this visit.  There is no height or weight on file to calculate BMI.  GENERAL: vitals reviewed and listed above, alert,  oriented, appears well hydrated and in no acute distress HEENT: atraumatic, conjunctiva  clear, no obvious abnormalities on inspection of external nose and ears OP : no lesion edema or exudate  NECK: no obvious masses on inspection palpation  LUNGS: clear to auscultation bilaterally, no wheezes, rales or rhonchi, good air movement CV: HRRR, no clubbing cyanosis or  peripheral edema nl cap refill  MS: moves all extremities without noticeable focal  abnormality PSYCH: pleasant and cooperative, no obvious depression or anxiety Lab Results  Component Value Date   WBC 6.2 04/29/2022   HGB 14.0 04/29/2022   HCT 41.5 04/29/2022   PLT 276.0 04/29/2022   GLUCOSE 82 04/29/2022   CHOL 105 05/26/2019   TRIG 70 05/26/2019   HDL 61 05/26/2019    LDLCALC 30 05/26/2019   ALT 13 04/29/2022   AST 19 04/29/2022   NA 139 04/29/2022   K 3.9 04/29/2022   CL 106 04/29/2022   CREATININE 0.96 04/29/2022   BUN 13 04/29/2022   CO2 24 04/29/2022   TSH 0.87 04/29/2022   BP Readings from Last 3 Encounters:  01/01/23 104/70  05/23/22 98/80  04/29/22 106/64    ASSESSMENT AND PLAN:  Discussed the following assessment and plan:  No diagnosis found.  -Patient advised to return or notify health care team  if  new concerns arise.  There are no Patient Instructions on file for this visit.   Neta Mends. Kairee Isa M.D.

## 2023-04-02 ENCOUNTER — Ambulatory Visit: Payer: 59

## 2023-04-30 NOTE — Progress Notes (Signed)
Chief Complaint  Patient presents with   Annual Exam    HPI: Patient  Janice Sutton  22 y.o. comes in today for Preventive Health Care visit   but  concern about mood and depressive anxiety sx  And ask about restarting depoprovera ( ocps hard to remember to take)   Had bv and rx sti screen neg 4 24  cannot tolerate oral flagyl but vaginal ok better now but gets recurrent  Last pap in system was 2021 at [facility]. But no fu appt   Family hx neg mh  Lives with  mom  finished school  finances . Doing [employer]  in interim is down a lot recently   Just finished school .  Feb stir processing    when  age 21 , 26 cutting . No now.    Cry every day .  At times  not actively suicidal   [place].   Anixety attacks  at times thinking about  counseling  1 partner  relationship since 45 moved back to [facility] Maintenance  Topic Date Due   PAP-Cervical Cytology Screening  06/02/2023   PAP SMEAR-Modifier  06/02/2023   COVID-19 Vaccine (1 - 2023-24 season) 05/17/2023 (Originally 06/07/2022)   INFLUENZA VACCINE  05/08/2023   CHLAMYDIA SCREENING  01/01/2024   DTaP/Tdap/Td (2 - Td or Tdap) 06/01/2030   HPV VACCINES  Completed   Hepatitis C Screening  Completed   HIV Screening  Completed   Health Maintenance Review LIFESTYLE:  Exercise:   Tobacco/ETS: ocass vap Alcohol:  ocass less now Sugar beverages: Sleep:intermittent Drug use: no HH of lives with mom Work: Therapist, 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    ROS:  GEN/ HEENT: No fever, some weight loss not that hungry from mood  changes sweats headaches vision problems hearing changes, CV/ PULM; No chest pain shortness of breath cough, syncope,edema  change in exercise tolerance. GI /GU: No adominal pain, vomiting, change in bowel habits. No blood in the stool. No significant GU symptoms.recurrent bv SKIN/HEME: ,no acute skin rashes suspicious lesions or bleeding. No lymphadenopathy, nodules, masses.  NEURO/ PSYCH:  No  neurologic signs such as weakness numbness.  IMM/ Allergy: No unusual infections.  Allergy .   REST of 12 system review negative except as per HPI   Past Medical History:  Diagnosis Date   ADD (attention deficit disorder)    STD (sexually transmitted disease) 07/2016   Chlamydia     History reviewed. No pertinent surgical history.  Family History  Problem Relation Age of Onset   Asthma Mother     Social History   Socioeconomic History   Marital status: Single    Spouse name: Not on file   Number of children: Not on file   Years of education: Not on file   Highest education level: Some college, no degree  Occupational History   Not on file  Tobacco Use   Smoking status: Former    Current packs/day: 0.00    Types: Cigarettes    Quit date: 08/20/2019    Years since quitting: 3.7   Smokeless tobacco: Never  Vaping Use   Vaping status: Never Used  Substance and Sexual Activity   Alcohol use: No   Drug use: No   Sexual activity: Yes    Partners: Male    Birth control/protection: None  Other Topics Concern   Not on file  Social History Narrative   2 people living in  the home. Mother and teen    pet yorkipoo   Moved from [place]   Father high school incarcerated    mother  Janice Sutton ;history of asthma; MBA accounting and finance   Negative ETS /FA   Social Determinants of Health   Financial Resource Strain: Medium Risk (03/27/2023)   Overall Financial Resource Strain (CARDIA)    Difficulty of Paying Living Expenses: Somewhat hard  Food Insecurity: No Food Insecurity (03/27/2023)   Hunger Vital Sign    Worried About Running Out of Food in the Last Year: Never true    Ran Out of Food in the Last Year: Never true  Transportation Needs: No Transportation Needs (03/27/2023)   PRAPARE - Administrator, Civil Service (Medical): No    Lack of Transportation (Non-Medical): No  Physical Activity: Sufficiently Active (03/27/2023)   Exercise  Vital Sign    Days of Exercise per Week: 7 days    Minutes of Exercise per Session: 30 min  Stress: Stress Concern Present (03/27/2023)   [employer] of Occupational Health - Occupational Stress Questionnaire    Feeling of Stress : Very much  Social Connections: Moderately Isolated (03/27/2023)   Social Connection and Isolation Panel [NHANES]    Frequency of Communication with Friends and Family: More than three times a week    Frequency of Social Gatherings with Friends and Family: Twice a week    Attends Religious Services: More than 4 times per year    Active Member of [employer] or Organizations: No    Attends Engineer, structural: Not on file    Marital Status: Never married    Outpatient Medications Prior to Visit  Medication Sig Dispense Refill   medroxyPROGESTERone (DEPO-PROVERA) 150 MG/ML injection Inject 150 mg into the muscle every 3 (three) months.     betamethasone valerate ointment (VALISONE) 0.1 % Use a pea sized amount topically BID for up to one week as needed 15 g 0   No facility-administered medications prior to visit.     EXAM:  BP 90/68   Pulse 81   Temp 98.1 F (36.7 C)   Ht 5\' 3"  (1.6 m)   Wt 114 lb 6.4 oz (51.9 kg)   SpO2 99%   BMI 20.27 kg/m   Body mass index is 20.27 kg/m. Wt Readings from Last 3 Encounters:  05/01/23 114 lb 6.4 oz (51.9 kg)  01/01/23 118 lb 12.8 oz (53.9 kg)  05/23/22 121 lb 12.8 oz (55.2 kg)    Physical Exam: Vital signs reviewed [id number] is a well-developed well-nourished alert cooperative    who appearsr stated age in no acute distress.  HEENT: normocephalic atraumatic , Eyes: PERRL EOM's full, conjunctiva clear, Nares: paten,t no deformity discharge or tenderness., Ears: no deformity EAC's clear TMs with normal landmarks. Mouth: clear OP, no lesions, edema.  Moist mucous membranes. Dentition in adequate repair. NECK: supple without masses, thyromegaly or bruits. CHEST/PULM:  Clear to auscultation and percussion  breath sounds equal no wheeze , rales or rhonchi. No chest wall deformities or tenderness. Breast: normal by inspection . No dimpling, discharge, masses, tenderness or discharge . CV: PMI is nondisplaced, S1 S2 no gallops, murmurs, rubs. Peripheral pulses are full without delay.No JVD .  ABDOMEN: Bowel sounds normal nontender  No guard or rebound, no hepato splenomegal no CVA tenderness.  No hernia. Extremtities:  No clubbing cyanosis or edema, no acute joint swelling or redness no focal atrophy NEURO:  Oriented x3, cranial nerves 3-12  appear to be intact, no obvious focal weakness,gait within normal limits no abnormal reflexes or asymmetrical SKIN: No acute rashes normal turgor, color, no bruising or petechiae. PSYCH: Oriented, good eye contact, good eye contact and speech cognition and judgment appear normal. LN: no cervical axillary inguinal adenopathy  Lab Results  Component Value Date   WBC 7.3 05/01/2023   HGB 15.5 (H) 05/01/2023   HCT 48.0 (H) 05/01/2023   PLT 304.0 05/01/2023   GLUCOSE 61 (L) 05/01/2023   CHOL 152 05/01/2023   TRIG 92.0 05/01/2023   HDL 82.00 05/01/2023   LDLCALC 52 05/01/2023   ALT 15 05/01/2023   AST 23 05/01/2023   NA 140 05/01/2023   K 3.5 05/01/2023   CL 104 05/01/2023   CREATININE 0.78 05/01/2023   BUN 8 05/01/2023   CO2 24 05/01/2023   TSH 0.81 05/01/2023   HGBA1C 5.1 05/01/2023    BP Readings from Last 3 Encounters:  05/01/23 90/68  01/01/23 104/70  05/23/22 98/80    Lab rplan reviewed with patient   ASSESSMENT AND PLAN:  Discussed the following assessment and plan:    ICD-10-CM   1. Visit for preventive health examination  Z00.00 Basic metabolic panel    CBC with Differential/Platelet    Hepatic function panel    Lipid panel    TSH    T4, free    Hemoglobin A1c    HIV antibody (with reflex)    RPR    2. Screening examination for STI  Z11.3 HIV antibody (with reflex)    RPR    3. Adjustment reaction with anxiety and  depression  F43.23     4. Weight loss  R63.4 Basic metabolic panel    CBC with Differential/Platelet    Hepatic function panel    Lipid panel    TSH    T4, free    Hemoglobin A1c    HIV antibody (with reflex)    RPR    5. Stress reaction  F43.0 Basic metabolic panel    CBC with Differential/Platelet    Hepatic function panel    Lipid panel    TSH    T4, free    Hemoglobin A1c    HIV antibody (with reflex)    RPR    6. Depo-Provera contraceptive status  Z30.42 POCT urine pregnancy    medroxyPROGESTERone Acetate SUSY 150 mg    7. Need for HPV vaccine  Z23 HPV 9-valent vaccine,Recombinat    Disc about mood  trial add hydroxyzine if  needed Advise counseling  as discussed . Ok to restart depo if neg ucg .  Recurrent BV  recently treated . Return in a month to reassess mood an do PAP  exam routine.  Give last hpv 3 today   Lab assessment  Return in about 1 month (around 06/01/2023) for pap smear and med follow up.  Patient Care Team: Sterling Mondo, Neta Mends, MD as PCP - General (Internal Medicine) Romualdo Bolk, MD (Inactive) as Consulting Physician (Obstetrics and Gynecology) Patient Instructions  Good to see you today.   See counselor as we discussed  Can try adding as needed  hydroxyzine for anxiety  ( can cause  drowsiness)  avoid alcohol and supplements vaping as possible.   Get adequate sleep  Lab today  to check for anemia thyroid blood sugar .   Plan depo  today if neg ucg .   Plan rov in about a month can do pap and fu on anxiety lab  etc.  And how doing . Any other advice .   Neta Mends. Coni Homesley M.D.

## 2023-05-01 ENCOUNTER — Ambulatory Visit (INDEPENDENT_AMBULATORY_CARE_PROVIDER_SITE_OTHER): Payer: 59 | Admitting: Internal Medicine

## 2023-05-01 VITALS — BP 90/68 | HR 81 | Temp 98.1°F | Ht 63.0 in | Wt 114.4 lb

## 2023-05-01 DIAGNOSIS — F430 Acute stress reaction: Secondary | ICD-10-CM

## 2023-05-01 DIAGNOSIS — F4323 Adjustment disorder with mixed anxiety and depressed mood: Secondary | ICD-10-CM

## 2023-05-01 DIAGNOSIS — Z0000 Encounter for general adult medical examination without abnormal findings: Secondary | ICD-10-CM | POA: Diagnosis not present

## 2023-05-01 DIAGNOSIS — Z23 Encounter for immunization: Secondary | ICD-10-CM | POA: Diagnosis not present

## 2023-05-01 DIAGNOSIS — R634 Abnormal weight loss: Secondary | ICD-10-CM | POA: Diagnosis not present

## 2023-05-01 DIAGNOSIS — Z3042 Encounter for surveillance of injectable contraceptive: Secondary | ICD-10-CM

## 2023-05-01 DIAGNOSIS — Z113 Encounter for screening for infections with a predominantly sexual mode of transmission: Secondary | ICD-10-CM

## 2023-05-01 LAB — CBC WITH DIFFERENTIAL/PLATELET
Basophils Absolute: 0.1 10*3/uL (ref 0.0–0.1)
Basophils Relative: 0.9 % (ref 0.0–3.0)
Eosinophils Absolute: 0.3 10*3/uL (ref 0.0–0.7)
Eosinophils Relative: 3.5 % (ref 0.0–5.0)
HCT: 48 % — ABNORMAL HIGH (ref 36.0–46.0)
Hemoglobin: 15.5 g/dL — ABNORMAL HIGH (ref 12.0–15.0)
Lymphocytes Relative: 38.2 % (ref 12.0–46.0)
Lymphs Abs: 2.8 10*3/uL (ref 0.7–4.0)
MCHC: 32.4 g/dL (ref 30.0–36.0)
MCV: 101.5 fl — ABNORMAL HIGH (ref 78.0–100.0)
Monocytes Absolute: 0.6 10*3/uL (ref 0.1–1.0)
Monocytes Relative: 8.8 % (ref 3.0–12.0)
Neutro Abs: 3.5 10*3/uL (ref 1.4–7.7)
Neutrophils Relative %: 48.6 % (ref 43.0–77.0)
Platelets: 304 10*3/uL (ref 150.0–400.0)
RBC: 4.73 Mil/uL (ref 3.87–5.11)
RDW: 14.1 % (ref 11.5–15.5)
WBC: 7.3 10*3/uL (ref 4.0–10.5)

## 2023-05-01 LAB — BASIC METABOLIC PANEL
BUN: 8 mg/dL (ref 6–23)
CO2: 24 mEq/L (ref 19–32)
Calcium: 9.3 mg/dL (ref 8.4–10.5)
Chloride: 104 mEq/L (ref 96–112)
Creatinine, Ser: 0.78 mg/dL (ref 0.40–1.20)
GFR: 106.41 mL/min (ref >60.00)
Glucose, Bld: 61 mg/dL — ABNORMAL LOW (ref 70–99)
Potassium: 3.5 mEq/L (ref 3.5–5.1)
Sodium: 140 mEq/L (ref 135–145)

## 2023-05-01 LAB — TSH: TSH: 0.81 u[IU]/mL (ref 0.35–5.50)

## 2023-05-01 LAB — LIPID PANEL
Cholesterol: 152 mg/dL (ref 0–200)
HDL: 82 mg/dL (ref >39.00)
LDL Cholesterol: 52 mg/dL (ref 0–99)
NonHDL: 70.08
Total CHOL/HDL Ratio: 2
Triglycerides: 92 mg/dL (ref 0.0–149.0)
VLDL: 18.4 mg/dL (ref 0.0–40.0)

## 2023-05-01 LAB — HEPATIC FUNCTION PANEL
ALT: 15 U/L (ref 0–35)
AST: 23 U/L (ref 0–37)
Albumin: 4.6 g/dL (ref 3.5–5.2)
Alkaline Phosphatase: 62 U/L (ref 39–117)
Bilirubin, Direct: 0.1 mg/dL (ref 0.0–0.3)
Total Bilirubin: 0.4 mg/dL (ref 0.2–1.2)
Total Protein: 7.9 g/dL (ref 6.0–8.3)

## 2023-05-01 LAB — POCT URINE PREGNANCY: Preg Test, Ur: NEGATIVE

## 2023-05-01 LAB — T4, FREE: Free T4: 1.07 ng/dL (ref 0.60–1.60)

## 2023-05-01 LAB — HEMOGLOBIN A1C: Hgb A1c MFr Bld: 5.1 % (ref 4.6–6.5)

## 2023-05-01 MED ORDER — MEDROXYPROGESTERONE ACETATE 150 MG/ML IM SUSY
150.0000 mg | PREFILLED_SYRINGE | INTRAMUSCULAR | Status: AC
Start: 2023-05-01 — End: 2023-05-01
  Administered 2023-05-01: 150 mg via INTRAMUSCULAR

## 2023-05-01 MED ORDER — HYDROXYZINE PAMOATE 25 MG PO CAPS: 25.0000 mg | ORAL_CAPSULE | Freq: Three times a day (TID) | ORAL | 0 refills | Status: DC | PRN

## 2023-05-01 MED ORDER — BETAMETHASONE VALERATE 0.1 % EX OINT: TOPICAL_OINTMENT | CUTANEOUS | 0 refills | Status: DC

## 2023-05-01 NOTE — Patient Instructions (Addendum)
Good to see you today.   See counselor as we discussed  Can try adding as needed  hydroxyzine for anxiety  ( can cause  drowsiness)  avoid alcohol and supplements vaping as possible.   Get adequate sleep  Lab today  to check for anemia thyroid blood sugar .   Plan depo  today if neg ucg .   Plan rov in about a month can do pap and fu on anxiety lab etc.  And how doing . Any other advice .

## 2023-05-04 NOTE — Progress Notes (Signed)
Results are  normal  except for slight elevation of  hg uncertain significant ( can get with dehydration but no diabetes and  thyroid   sti screen negative .   Will follow and can recheck at  next visit.

## 2023-05-05 ENCOUNTER — Encounter: Payer: Self-pay | Admitting: Internal Medicine

## 2023-08-04 ENCOUNTER — Ambulatory Visit: Payer: 59 | Admitting: Family Medicine

## 2023-08-04 ENCOUNTER — Encounter: Payer: Self-pay | Admitting: Family Medicine

## 2023-08-04 VITALS — BP 98/65 | HR 70 | Temp 98.2°F | Resp 16 | Ht 63.0 in | Wt 123.4 lb

## 2023-08-04 DIAGNOSIS — J029 Acute pharyngitis, unspecified: Secondary | ICD-10-CM | POA: Diagnosis not present

## 2023-08-04 DIAGNOSIS — Z309 Encounter for contraceptive management, unspecified: Secondary | ICD-10-CM

## 2023-08-04 DIAGNOSIS — Z3042 Encounter for surveillance of injectable contraceptive: Secondary | ICD-10-CM | POA: Diagnosis not present

## 2023-08-04 LAB — POCT URINE PREGNANCY: Preg Test, Ur: NEGATIVE

## 2023-08-04 LAB — POC COVID19 BINAXNOW: SARS Coronavirus 2 Ag: NEGATIVE

## 2023-08-04 LAB — POCT RAPID STREP A (OFFICE): Rapid Strep A Screen: NEGATIVE

## 2023-08-04 MED ORDER — MEDROXYPROGESTERONE ACETATE 150 MG/ML IM SUSP
150.0000 mg | Freq: Once | INTRAMUSCULAR | Status: AC
  Administered 2023-08-04: 150 mg via INTRAMUSCULAR

## 2023-08-04 MED ORDER — AMOXICILLIN 500 MG PO CAPS: 500.0000 mg | ORAL_CAPSULE | Freq: Three times a day (TID) | ORAL | 0 refills | Status: AC

## 2023-08-04 NOTE — Patient Instructions (Signed)
It was very nice to see you today!  Symptomatic treatment.   Amox sent to pharmacy.  Off work for days.      PLEASE NOTE:  If you had any lab tests please let us know if you have not heard back within a few days. You may see your results on MyChart before we have a chance to review them but we will give you a call once they are reviewed by [place]. If we ordered any referrals today, please let us know if you have not heard from their office within the next week.   Please try these tips to maintain a healthy lifestyle:  Eat most of your calories during the day when you are active. Eliminate processed foods including packaged sweets (pies, cakes, cookies), reduce intake of potatoes, white bread, white pasta, and white rice. Look for whole grain options, oat flour or almond flour.  Each meal should contain half fruits/vegetables, one quarter protein, and one quarter carbs (no bigger than a computer mouse).  Cut down on sweet beverages. This includes juice, soda, and sweet tea. Also watch fruit intake, though this is a healthier sweet option, it still contains natural sugar! Limit to 3 servings daily.  Drink at least 1 glass of water with each meal and aim for at least 8 glasses per day  Exercise at least 150 minutes every week.

## 2023-08-04 NOTE — Progress Notes (Signed)
   Subjective:     Patient ID: Janice Sutton, female    DOB: 2001/04/28, 22 y.o.   MRN: [id number]  Chief Complaint  Patient presents with   Sore Throat    Sore throat x 2 or 3 days, losing voice    Nasal Congestion   Itchy Eyes    HPI  Sore throat for 3-4 days.  Runny nose/sneezing.  "Prone" to strep.  Job told her needs note-pt just feels poorly and hard to work.  Nervous about getting tonsils out so hasn't. Getting hoarse.  Tactile temps at night. Sore throat worse at night. Intermitt ear pain R>L.Marland Kitchen  some mild ha.  No cough but clearing throat a lot.  This is her typical presentation for strep.  Depo over due few days.  Not SA for >69months.  Health Maintenance Due  Topic Date Due   Cervical Cancer Screening (Pap smear)  06/02/2023    Past Medical History:  Diagnosis Date   ADD (attention deficit disorder)    STD (sexually transmitted disease) 07/2016   Chlamydia     History reviewed. No pertinent surgical history.   Current Outpatient Medications:    medroxyPROGESTERone (DEPO-PROVERA) 150 MG/ML injection, Inject 150 mg into the muscle every 3 (three) months., Disp: , Rfl:   Current Facility-Administered Medications:    medroxyPROGESTERone (DEPO-PROVERA) injection 150 mg, 150 mg, Intramuscular, Once, Jeani Sow, MD  No Known Allergies ROS neg/noncontributory except as noted HPI/below      Objective:     BP 98/65   Pulse 70   Temp 98.2 F (36.8 C) (Temporal)   Resp 16   Ht 5\' 3"  (1.6 m)   Wt 123 lb 6 oz (56 kg)   SpO2 98%   BMI 21.85 kg/m  Wt Readings from Last 3 Encounters:  08/04/23 123 lb 6 oz (56 kg)  05/01/23 114 lb 6.4 oz (51.9 kg)  01/01/23 118 lb 12.8 oz (53.9 kg)    Physical Exam   Gen: WDWN NAD HEENT: NCAT, conjunctiva not injected, sclera nonicteric TM WNL B, OP moist, no exudates but red.  Some hoarseness  NECK:  supple, no thyromegaly, + tender nodes, no carotid bruits CARDIAC: RRR, S1S2+, no murmur. DP 2+B LUNGS: CTAB. No  wheezes ABDOMEN:  BS+, soft, sl tender upper abd, No HSM, no masses EXT:  no edema MSK: no gross abnormalities.  NEURO: A&O x3.  CN II-XII intact.  PSYCH: normal mood. Good eye contact  Results for orders placed or performed in visit on 08/04/23  POCT urine pregnancy  Result Value Ref Range   Preg Test, Ur Negative Negative  POC COVID-19  Result Value Ref Range   SARS Coronavirus 2 Ag Negative Negative  POCT rapid strep A  Result Value Ref Range   Rapid Strep A Screen Negative Negative        Assessment & Plan:  Pharyngitis, unspecified etiology -     POC COVID-19 BinaxNow -     POCT rapid strep A  Depo-Provera contraceptive status -     POCT urine pregnancy  Encounter for contraceptive management, unspecified type -     medroxyPROGESTERone Acetate   Pharyngitis-recurrant.  Worsening  off work 2 days. Amox 500 bid.  Symptomatic tx.  Return in about 3 months (around 11/04/2023) for Dr Fabian Sharp for pap and depo.  Angelena Sole, MD

## 2023-11-10 ENCOUNTER — Ambulatory Visit: Payer: 59 | Admitting: Family Medicine

## 2023-11-12 ENCOUNTER — Ambulatory Visit: Payer: 59 | Admitting: Family Medicine

## 2023-11-12 VITALS — BP 98/68 | HR 104 | Temp 99.7°F | Ht 63.0 in | Wt 127.8 lb

## 2023-11-12 DIAGNOSIS — L818 Other specified disorders of pigmentation: Secondary | ICD-10-CM | POA: Diagnosis not present

## 2023-11-12 DIAGNOSIS — Z3042 Encounter for surveillance of injectable contraceptive: Secondary | ICD-10-CM | POA: Diagnosis not present

## 2023-11-12 LAB — POCT URINE PREGNANCY: Preg Test, Ur: NEGATIVE

## 2023-11-12 MED ORDER — MEDROXYPROGESTERONE ACETATE 150 MG/ML IM SUSY
150.0000 mg | PREFILLED_SYRINGE | Freq: Once | INTRAMUSCULAR | Status: AC
  Administered 2023-11-12: 150 mg via INTRAMUSCULAR

## 2023-11-12 MED ORDER — MEDROXYPROGESTERONE ACETATE 104 MG/0.65ML ~~LOC~~ SUSY: 104.0000 mg | PREFILLED_SYRINGE | Freq: Once | SUBCUTANEOUS | Status: DC

## 2023-11-12 NOTE — Progress Notes (Signed)
 Established Patient Office Visit   Subjective  Patient ID: Janice Sutton, female    DOB: 2001-01-22  Age: 23 y.o. MRN: [id number]  Chief Complaint  Patient presents with   Contraception    Depo shot    Wound Infection    Right arm Tattoo infection started 2 weeks ago     Patient is a 23 year old female followed by Dr. Charlett and seen today for ongoing concern.  Patient requesting Depo Provera  injection.  Last Depo 08/04/2023.  Patient also with an acute concern for possible tensor infection.  Patient got a tattoo on right anterior wrist on 11/01/2022.  Concerned wrist was painful and discolored longer than most of her other tattoos.  Feels like it may have had pus.  Pt she put a clear tape over wrist and clean the area with antibacterial soap.    Patient Active Problem List   Diagnosis Date Noted   Depo-Provera  contraceptive status 01/18/2019   History of chlamydia 01/18/2019   Dysmenorrhea 07/24/2014   Irregular menstrual cycle 07/24/2014   History of ADHD 04/16/2013   ADHD (attention deficit hyperactivity disorder) 04/16/2013   Past Medical History:  Diagnosis Date   ADD (attention deficit disorder)    STD (sexually transmitted disease) 07/2016   Chlamydia    History reviewed. No pertinent surgical history. Social History   Tobacco Use   Smoking status: Former    Current packs/day: 0.00    Types: Cigarettes    Quit date: 08/20/2019    Years since quitting: 4.2   Smokeless tobacco: Never  Vaping Use   Vaping status: Some Days  Substance Use Topics   Alcohol use: No   Drug use: No   Family History  Problem Relation Age of Onset   Asthma Mother    No Known Allergies    ROS Negative unless stated above    Objective:     BP 98/68 (BP Location: Right Arm, Patient Position: Sitting, Cuff Size: Normal)   Pulse (!) 104   Temp 99.7 F (37.6 C) (Oral)   Ht 5' 3 (1.6 m)   Wt 127 lb 12.8 oz (58 kg)   LMP  (LMP Unknown)   SpO2 99%   Breastfeeding No    BMI 22.64 kg/m  BP Readings from Last 3 Encounters:  11/12/23 98/68  08/04/23 98/65  05/01/23 90/68   Wt Readings from Last 3 Encounters:  11/12/23 127 lb 12.8 oz (58 kg)  08/04/23 123 lb 6 oz (56 kg)  05/01/23 114 lb 6.4 oz (51.9 kg)      Physical Exam Constitutional:      Appearance: Normal appearance.  HENT:     Head: Normocephalic and atraumatic.     Nose: Nose normal.     Mouth/Throat:     Mouth: Mucous membranes are moist.  Cardiovascular:     Rate and Rhythm: Normal rate.  Pulmonary:     Effort: Pulmonary effort is normal.  Skin:    General: Skin is warm and dry.     Comments: Clear tape cover removed from right wrist.  A healing tattoo 444 and red ink noted.  Possible area of faint ecchymosis underneath middle 4 of tattoo.  Superficial veins seen at site of tattoo.  Right anterior wrist without erythema, induration, purulent drainage, increased warmth.  Neurological:     Mental Status: She is alert and oriented to person, place, and time.      Results for orders placed or performed in visit on 11/12/23  POCT urine pregnancy  Result Value Ref Range   Preg Test, Ur Negative Negative      Assessment & Plan:  Tattoo of skin  Depo-Provera  contraceptive status -     POCT urine pregnancy  Patient with new tattoo of right forearm x 2 weeks.  Area appears to be healing.  Discussed S/S of infection.  Patient advised to keep clean with antibacterial soap such as Dial and to leave open to air when possible.  If desires to cover to prevent irritation from clothing or surfaces use sterile gauze and paper tape.  Follow-up as needed for continued or worsening symptoms.  POC hCG negative.  Depo-Provera  given.  Follow-up in 3 months for next Depo-Provera .  Will need to follow-up with PCP for physical and Pap.  Return in about 3 months (around 02/09/2024), or if symptoms worsen or fail to improve, for Depo-Provera .   Janice JONELLE Single, MD

## 2023-12-23 NOTE — Progress Notes (Unsigned)
 No chief complaint on file.   HPI: Janice Sutton 23 y.o. come in for GYNE exam Last pv [facility] summer 24  but due for pap  last done was 8 21 neg pap but pos NG   from gyne office  dr Kathryne Gin al  Had new tatoo infection last month seen   ROS: See pertinent positives and negatives per HPI.  Past Medical History:  Diagnosis Date   ADD (attention deficit disorder)    STD (sexually transmitted disease) 07/2016   Chlamydia     Family History  Problem Relation Age of Onset   Asthma Mother     Social History   Socioeconomic History   Marital status: Single    Spouse name: Not on file   Number of children: 0   Years of education: Not on file   Highest education level: Some college, no degree  Occupational History   Occupation: [employer]  Tobacco Use   Smoking status: Former    Current packs/day: 0.00    Types: Cigarettes    Quit date: 08/20/2019    Years since quitting: 4.3   Smokeless tobacco: Never  Vaping Use   Vaping status: Some Days  Substance and Sexual Activity   Alcohol use: No   Drug use: No   Sexual activity: Yes    Partners: Male    Birth control/protection: None  Other Topics Concern   Not on file  Social History Narrative   2 people living in the home. Mother and teen    pet yorkipoo   Moved from [place]   Father high school incarcerated    mother  Janice Sutton ;history of asthma; MBA accounting and finance   Negative ETS /FA   Social Drivers of Health   Financial Resource Strain: High Risk (11/12/2023)   Overall Financial Resource Strain (CARDIA)    Difficulty of Paying Living Expenses: Very hard  Food Insecurity: Food Insecurity Present (11/12/2023)   Hunger Vital Sign    Worried About Running Out of Food in the Last Year: Never true    Ran Out of Food in the Last Year: Often true  Transportation Needs: No Transportation Needs (11/12/2023)   PRAPARE - Administrator, Civil Service (Medical): No    Lack of  Transportation (Non-Medical): No  Physical Activity: Sufficiently Active (11/12/2023)   Exercise Vital Sign    Days of Exercise per Week: 7 days    Minutes of Exercise per Session: 30 min  Stress: Stress Concern Present (11/12/2023)   [employer] of Occupational Health - Occupational Stress Questionnaire    Feeling of Stress : To some extent  Social Connections: Moderately Isolated (11/12/2023)   Social Connection and Isolation Panel [NHANES]    Frequency of Communication with Friends and Family: More than three times a week    Frequency of Social Gatherings with Friends and Family: Twice a week    Attends Religious Services: More than 4 times per year    Active Member of [employer] or Organizations: No    Attends Engineer, structural: Not on file    Marital Status: Never married    Outpatient Medications Prior to Visit  Medication Sig Dispense Refill   medroxyPROGESTERone (DEPO-PROVERA) 150 MG/ML injection Inject 150 mg into the muscle every 3 (three) months.     No facility-administered medications prior to visit.     EXAM:  There were no vitals taken for this visit.  There is no height or weight  on file to calculate BMI.  GENERAL: vitals reviewed and listed above, alert, oriented, appears well hydrated and in no acute distress HEENT: atraumatic, conjunctiva  clear, no obvious abnormalities on inspection of external nose and ears OP : no lesion edema or exudate  NECK: no obvious masses on inspection palpation  LUNGS: clear to auscultation bilaterally, no wheezes, rales or rhonchi, good air movement CV: HRRR, no clubbing cyanosis or  peripheral edema nl cap refill  MS: moves all extremities without noticeable focal  abnormality PSYCH: pleasant and cooperative, no obvious depression or anxiety Lab Results  Component Value Date   WBC 7.3 05/01/2023   HGB 15.5 (H) 05/01/2023   HCT 48.0 (H) 05/01/2023   PLT 304.0 05/01/2023   GLUCOSE 61 (L) 05/01/2023   CHOL 152  05/01/2023   TRIG 92.0 05/01/2023   HDL 82.00 05/01/2023   LDLCALC 52 05/01/2023   ALT 15 05/01/2023   AST 23 05/01/2023   NA 140 05/01/2023   K 3.5 05/01/2023   CL 104 05/01/2023   CREATININE 0.78 05/01/2023   BUN 8 05/01/2023   CO2 24 05/01/2023   TSH 0.81 05/01/2023   HGBA1C 5.1 05/01/2023   BP Readings from Last 3 Encounters:  11/12/23 98/68  08/04/23 98/65  05/01/23 90/68    ASSESSMENT AND PLAN:  Discussed the following assessment and plan:  No diagnosis found.  -Patient advised to return or notify health care team  if  new concerns arise.  There are no Patient Instructions on file for this visit.   Neta Mends. Janice Sutton M.D.

## 2023-12-24 ENCOUNTER — Other Ambulatory Visit (HOSPITAL_COMMUNITY)
Admission: RE | Admit: 2023-12-24 | Discharge: 2023-12-24 | Disposition: A | Discharge status: Home / Self Care | Source: Ambulatory Visit | Attending: Internal Medicine | Admitting: Internal Medicine

## 2023-12-24 ENCOUNTER — Encounter: Payer: Self-pay | Admitting: Internal Medicine

## 2023-12-24 ENCOUNTER — Ambulatory Visit: Admitting: Internal Medicine

## 2023-12-24 ENCOUNTER — Ambulatory Visit: Payer: 59 | Admitting: Internal Medicine

## 2023-12-24 VITALS — BP 100/70 | HR 118 | Temp 97.6°F | Ht 63.0 in | Wt 133.6 lb

## 2023-12-24 DIAGNOSIS — Z01419 Encounter for gynecological examination (general) (routine) without abnormal findings: Secondary | ICD-10-CM

## 2023-12-24 DIAGNOSIS — Z0000 Encounter for general adult medical examination without abnormal findings: Secondary | ICD-10-CM

## 2023-12-24 DIAGNOSIS — Z124 Encounter for screening for malignant neoplasm of cervix: Secondary | ICD-10-CM

## 2023-12-24 NOTE — Patient Instructions (Signed)
 Good to see you today  Exam is normal  will let  you know when results are back .   Yearly check and 3 month provera  at this time .

## 2023-12-25 LAB — CYTOLOGY - PAP
Chlamydia: NEGATIVE
Comment: NEGATIVE
Comment: NORMAL
Diagnosis: NEGATIVE
Neisseria Gonorrhea: NEGATIVE

## 2023-12-26 ENCOUNTER — Encounter: Payer: Self-pay | Admitting: Internal Medicine

## 2023-12-26 NOTE — Progress Notes (Signed)
 PAP normal  and sti infection screen negative .

## 2024-02-13 ENCOUNTER — Encounter (HOSPITAL_COMMUNITY): Payer: Self-pay

## 2024-02-13 ENCOUNTER — Ambulatory Visit: Payer: 59

## 2024-02-20 ENCOUNTER — Ambulatory Visit

## 2024-04-07 ENCOUNTER — Encounter: Payer: Self-pay | Admitting: Internal Medicine

## 2024-04-07 ENCOUNTER — Ambulatory Visit (INDEPENDENT_AMBULATORY_CARE_PROVIDER_SITE_OTHER): Admitting: Internal Medicine

## 2024-04-07 VITALS — BP 90/60 | HR 101 | Temp 98.0°F | Ht 63.0 in | Wt 138.8 lb

## 2024-04-07 DIAGNOSIS — R059 Cough, unspecified: Secondary | ICD-10-CM | POA: Diagnosis not present

## 2024-04-07 DIAGNOSIS — J029 Acute pharyngitis, unspecified: Secondary | ICD-10-CM

## 2024-04-07 DIAGNOSIS — Z3042 Encounter for surveillance of injectable contraceptive: Secondary | ICD-10-CM

## 2024-04-07 LAB — POC COVID19 BINAXNOW: SARS Coronavirus 2 Ag: NEGATIVE

## 2024-04-07 LAB — POCT RAPID STREP A (OFFICE): Rapid Strep A Screen: NEGATIVE

## 2024-04-07 LAB — POCT INFLUENZA A/B
Influenza A, POC: NEGATIVE
Influenza B, POC: NEGATIVE

## 2024-04-07 MED ORDER — AMOXICILLIN 500 MG PO CAPS: 500.0000 mg | ORAL_CAPSULE | Freq: Two times a day (BID) | ORAL | 0 refills | Status: AC

## 2024-04-07 NOTE — Progress Notes (Signed)
 Chief Complaint  Patient presents with   Sore Throat    Pt c/o sore throat since Thursday 6/26. Develop dry cough yesterday. Have chills, bodyache, feeling tired. Pt states she has recurrent tonsilitis since young age and needing to remove her tonsil.   Ear Pain    Pt c/o ear pain on both side but more on R side.    Medical Management of Chronic Issues    Pt would like get her depo-provera      HPI: Janice Sutton 23 y.o. come in for sda  new problem  Onset a week ago  of st and ear pain and congestion   not mitigating  feels like strep pain is significant and has had whit spots at times  No current fever but feels badly.  Min cough pos fatigue .  Also  to get dep provera .  Today   ROS: See pertinent positives and negatives per HPI.  Past Medical History:  Diagnosis Date   ADD (attention deficit disorder)    STD (sexually transmitted disease) 07/2016   Chlamydia     Family History  Problem Relation Age of Onset   Asthma Mother     Social History   Socioeconomic History   Marital status: Single    Spouse name: Not on file   Number of children: 0   Years of education: Not on file   Highest education level: Some college, no degree  Occupational History   Occupation: [employer]  Tobacco Use   Smoking status: Former    Current packs/day: 0.00    Types: Cigarettes    Quit date: 08/20/2019    Years since quitting: 4.6   Smokeless tobacco: Never  Vaping Use   Vaping status: Some Days  Substance and Sexual Activity   Alcohol use: No   Drug use: No   Sexual activity: Yes    Partners: Male    Birth control/protection: None  Other Topics Concern   Not on file  Social History Narrative   2 people living in the home. Mother and teen    pet yorkipoo   Moved from [place]    Father high school incarcerated    mother  Janice Sutton ;history of asthma; MBA accounting and finance   Negative ETS /FA   Social Drivers of Health   Financial Resource Strain:  High Risk (11/12/2023)   Overall Financial Resource Strain (CARDIA)    Difficulty of Paying Living Expenses: Very hard  Food Insecurity: Food Insecurity Present (11/12/2023)   Hunger Vital Sign    Worried About Running Out of Food in the Last Year: Never true    Ran Out of Food in the Last Year: Often true  Transportation Needs: No Transportation Needs (11/12/2023)   PRAPARE - Administrator, Civil Service (Medical): No    Lack of Transportation (Non-Medical): No  Physical Activity: Sufficiently Active (11/12/2023)   Exercise Vital Sign    Days of Exercise per Week: 7 days    Minutes of Exercise per Session: 30 min  Stress: Stress Concern Present (11/12/2023)   [employer] of Occupational Health - Occupational Stress Questionnaire    Feeling of Stress : To some extent  Social Connections: Moderately Isolated (11/12/2023)   Social Connection and Isolation Panel    Frequency of Communication with Friends and Family: More than three times a week    Frequency of Social Gatherings with Friends and Family: Twice a week    Attends Religious Services: More than 4  times per year    Active Member of Clubs or Organizations: No    Attends Banker Meetings: Not on file    Marital Status: Never married    Outpatient Medications Prior to Visit  Medication Sig Dispense Refill   medroxyPROGESTERone  (DEPO-PROVERA ) 150 MG/ML injection Inject 150 mg into the muscle every 3 (three) months. (Patient not taking: Reported on 12/24/2023)     No facility-administered medications prior to visit.     EXAM:  BP 90/60 (BP Location: Left Arm, Patient Position: Sitting, Cuff Size: Normal)   Pulse (!) 101   Temp 98 F (36.7 C) (Oral)   Ht 5' 3 (1.6 m)   Wt 138 lb 12.8 oz (63 kg)   SpO2 98%   BMI 24.59 kg/m   Body mass index is 24.59 kg/m.  GENERAL: vitals reviewed and listed above, alert, oriented, appears well hydrated and in no acute distress speech nl min hoarsenes  co of ant  neck pain  non toxic but feels unwell  HEENT: atraumatic, conjunctiva  clear, no obvious abnormalities on inspection of external nose and ears tmx clear  OP : no lesion edema or exudate seen mild erythema right  no petechia  NECK:supple  tender  r greater than left jdg nodes  no post nodes  LUNGS: clear to auscultation bilaterally, no wheezes, rales or rhonchi, good air movement CV: HRRR, no clubbing cyanosis or  peripheral edema nl cap refill  MS: moves all extremities without noticeable focal  abnormality Lab Results  Component Value Date   WBC 7.3 05/01/2023   HGB 15.5 (H) 05/01/2023   HCT 48.0 (H) 05/01/2023   PLT 304.0 05/01/2023   GLUCOSE 61 (L) 05/01/2023   CHOL 152 05/01/2023   TRIG 92.0 05/01/2023   HDL 82.00 05/01/2023   LDLCALC 52 05/01/2023   ALT 15 05/01/2023   AST 23 05/01/2023   NA 140 05/01/2023   K 3.5 05/01/2023   CL 104 05/01/2023   CREATININE 0.78 05/01/2023   BUN 8 05/01/2023   CO2 24 05/01/2023   TSH 0.81 05/01/2023   HGBA1C 5.1 05/01/2023   BP Readings from Last 3 Encounters:  04/07/24 90/60  12/24/23 100/70  11/12/23 98/68  Strep covid  flu screen negative   ASSESSMENT AND PLAN:  Discussed the following assessment and plan:  Sore throat - Plan: POC Influenza A/B, POC Rapid Strep A, POC COVID-19  Cough, unspecified type - Plan: POC Influenza A/B, POC COVID-19  Depo-Provera  contraceptive status Pharyngitis pain is  persistent and sever  prob causing referred ear pain .   Still may be viral but she has had sterp before and feels similar . Mono less likely    Relative rest  gargles supportive rx  Empiric adding amox  500 bid for 10 days ( as if strep)  risk benefit disc with patient  Work excuse  -Patient advised to return or notify health care team  if  new concerns arise.  Patient Instructions  Strep flu and covid tests screens are negative but I agree severity of sore throat  is atypical fo most viruses.   Emprific antibiotic  in case of  strep een though test negative .  Note for work   gargles  ibuprofen  as possible.  Expect improvement in the next 3 days .     [name] M.D.

## 2024-04-07 NOTE — Patient Instructions (Signed)
 Strep flu and covid tests screens are negative but I agree severity of sore throat  is atypical fo most viruses.   Emprific antibiotic  in case of strep een though test negative .  Note for work   gargles  ibuprofen  as possible.  Expect improvement in the next 3 days .

## 2024-04-08 LAB — POCT URINE PREGNANCY: Preg Test, Ur: NEGATIVE

## 2024-04-08 MED ORDER — MEDROXYPROGESTERONE ACETATE 150 MG/ML IM SUSY
PREFILLED_SYRINGE | Freq: Once | INTRAMUSCULAR | Status: AC
  Administered 2024-04-07: 150 mg via INTRAMUSCULAR

## 2024-04-08 NOTE — Addendum Note (Signed)
 Addended by: Mehek Grega on: 04/08/2024 12:45 PM   Modules accepted: Orders

## 2024-04-12 ENCOUNTER — Ambulatory Visit: Payer: Self-pay

## 2024-04-12 ENCOUNTER — Other Ambulatory Visit: Payer: Self-pay

## 2024-04-12 ENCOUNTER — Emergency Department (HOSPITAL_BASED_OUTPATIENT_CLINIC_OR_DEPARTMENT_OTHER)
Admission: EM | Admit: 2024-04-12 | Discharge: 2024-04-12 | Disposition: A | Discharge status: Home / Self Care | Attending: Emergency Medicine | Admitting: Emergency Medicine

## 2024-04-12 ENCOUNTER — Encounter (HOSPITAL_BASED_OUTPATIENT_CLINIC_OR_DEPARTMENT_OTHER): Payer: Self-pay | Admitting: Emergency Medicine

## 2024-04-12 DIAGNOSIS — R519 Headache, unspecified: Secondary | ICD-10-CM | POA: Diagnosis present

## 2024-04-12 DIAGNOSIS — J029 Acute pharyngitis, unspecified: Secondary | ICD-10-CM | POA: Diagnosis not present

## 2024-04-12 DIAGNOSIS — K121 Other forms of stomatitis: Secondary | ICD-10-CM | POA: Insufficient documentation

## 2024-04-12 LAB — GROUP A STREP BY PCR: Group A Strep by PCR: NOT DETECTED

## 2024-04-12 LAB — RESP PANEL BY RT-PCR (RSV, FLU A&B, COVID)  RVPGX2
Influenza A by PCR: NEGATIVE
Influenza B by PCR: NEGATIVE
Resp Syncytial Virus by PCR: NEGATIVE
SARS Coronavirus 2 by RT PCR: NEGATIVE

## 2024-04-12 MED ORDER — HYDROCODONE-ACETAMINOPHEN 5-325 MG PO TABS
1.0000 | ORAL_TABLET | Freq: Once | ORAL | Status: AC
  Administered 2024-04-12: 1 via ORAL
  Filled 2024-04-12: qty 1

## 2024-04-12 MED ORDER — HYDROCODONE-ACETAMINOPHEN 5-325 MG PO TABS: 1.0000 | ORAL_TABLET | ORAL | 0 refills | Status: DC | PRN

## 2024-04-12 MED ORDER — NYSTATIN 100000 UNIT/ML MT SUSP: 5.0000 mL | Freq: Four times a day (QID) | OROMUCOSAL | 0 refills | Status: DC | PRN

## 2024-04-12 MED ORDER — DEXAMETHASONE SODIUM PHOSPHATE 10 MG/ML IJ SOLN
10.0000 mg | Freq: Once | INTRAMUSCULAR | Status: AC
  Administered 2024-04-12: 10 mg via INTRAVENOUS
  Filled 2024-04-12: qty 1

## 2024-04-12 MED ORDER — SODIUM CHLORIDE 0.9 % IV BOLUS
1000.0000 mL | Freq: Once | INTRAVENOUS | Status: AC
  Administered 2024-04-12: 1000 mL via INTRAVENOUS

## 2024-04-12 MED ORDER — PROCHLORPERAZINE EDISYLATE 10 MG/2ML IJ SOLN
10.0000 mg | Freq: Once | INTRAMUSCULAR | Status: AC
  Administered 2024-04-12: 10 mg via INTRAVENOUS
  Filled 2024-04-12: qty 2

## 2024-04-12 NOTE — Discharge Instructions (Signed)
 As we discussed, use the Magic Mouthwash 4 times a day as needed for pain. Put a teaspoon into the mouth, swish so that it coats the roof of your mouth for pain relief, and spit out.   Avoid acidic foods like tomato based or spicy foods as these will cause increased pain.   Follow up with Dr. Charlett for recheck later this week.

## 2024-04-12 NOTE — ED Provider Notes (Signed)
 [facility] Provider Note   CSN: [id number] Arrival date & time: 04/12/24  8190     Patient presents with: Headache, Otalgia, and Sore Throat   Janice Sutton is a 23 y.o. female.   Patient to ED with persistent sore throat since 6/26. She was seen by her doctor (Panosh) on 7/3 and started on Amoxicillin  for suspected strep throat. Since that time, the patient has developed a frontal headache similar to her previous headaches but more intense and painful sores in the hard palette. No history of intraoral sores or herpes labialis. No bleeding. She is drinking but states it is difficult for her to eat. No nausea, vomiting. She reports she had a minimal cough but the Amoxil  seems to have resolved it. No fever.   The history is provided by the patient. No language interpreter was used.  Headache Associated symptoms: ear pain   Otalgia Associated symptoms: headaches   Sore Throat Associated symptoms include headaches.       Prior to Admission medications   Medication Sig Start Date End Date Taking? Authorizing Provider  HYDROcodone -acetaminophen  (NORCO/VICODIN) 5-325 MG tablet Take 1-2 tablets by mouth every 4 (four) hours as needed for severe pain (pain score 7-10). 04/12/24  Yes Reyanna Baley, Margit, PA-C  magic mouthwash (nystatin , lidocaine , diphenhydrAMINE, alum & mag hydroxide) suspension Swish and spit 5 mLs 4 (four) times daily as needed for mouth pain. 04/12/24  Yes Rannie Craney, PA-C  amoxicillin  (AMOXIL ) 500 MG capsule Take 1 capsule (500 mg total) by mouth 2 (two) times daily for 10 days. 04/07/24 04/17/24  Panosh, Apolinar POUR, MD  medroxyPROGESTERone  (DEPO-PROVERA ) 150 MG/ML injection Inject 150 mg into the muscle every 3 (three) months. Patient not taking: Reported on 12/24/2023    [provider]    Allergies: Patient has no known allergies.    Review of Systems  HENT:  Positive for ear pain.   Neurological:  Positive for  headaches.    Updated Vital Signs BP (!) 136/98 (BP Location: Right Arm)   Pulse 82   Temp 98.5 F (36.9 C)   Resp 16   Ht 5' 3 (1.6 m)   Wt 62.6 kg   SpO2 98%   BMI 24.45 kg/m   Physical Exam Constitutional:      Appearance: She is well-developed.  HENT:     Head: Normocephalic.     Mouth/Throat:     Comments: Confluent, ulcerated lesions to the anterior and posterior hard palette. No surrounding swelling. No drainage or bleeding. No lesions elsewhere in the mouth including buccal surfaces, tongue or oropharynx. No oropharyngeal redness, swelling or exudates.  Cardiovascular:     Rate and Rhythm: Normal rate and regular rhythm.     Heart sounds: No murmur heard. Pulmonary:     Effort: Pulmonary effort is normal.     Breath sounds: Normal breath sounds. No wheezing, rhonchi or rales.  Abdominal:     General: Bowel sounds are normal.     Palpations: Abdomen is soft.     Tenderness: There is no abdominal tenderness. There is no guarding or rebound.  Musculoskeletal:        General: Normal range of motion.     Cervical back: Normal range of motion and neck supple.  Skin:    General: Skin is warm and dry.  Neurological:     General: No focal deficit present.     Mental Status: She is alert and oriented to person, place, and  time.     (all labs ordered are listed, but only abnormal results are displayed) Labs Reviewed  RESP PANEL BY RT-PCR (RSV, FLU A&B, COVID)  [id number]  GROUP A STREP BY PCR    EKG: None  Radiology: No results found.   Procedures   Medications Ordered in the ED  sodium chloride  0.9 % bolus 1,000 mL (0 mLs Intravenous Stopped 04/12/24 2332)  dexamethasone  (DECADRON ) injection 10 mg (10 mg Intravenous Given 04/12/24 2149)  HYDROcodone -acetaminophen  (NORCO/VICODIN) 5-325 MG per tablet 1 tablet (1 tablet Oral Given 04/12/24 2148)  prochlorperazine  (COMPAZINE ) injection 10 mg (10 mg Intravenous Given 04/12/24 2332)    Clinical Course as of 04/12/24 2343   Mon Apr 12, 2024  2121 Patient with sore throat, HA, new oral sores that are painful. No fever. On Amoxil  since 7/3 for presumed strep. Strep and viral panel negative today. IV fluids, IV decadron  and hydrocodone  provided for treatment of complaints. Will consider Valtrex on discharge.  [SU]  2307 Pain is mildly improved with IV decadron . IV Compazine  ordered for HA. Anticipate discharge home with Magic Mouthwash, Norco. Encourage PCP follow up. [SU]  2337 Feels better after Compazine . Patient comfortable with discharge home.  [SU]    Clinical Course User Index [SU] Odell Balls, PA-C                                 Medical Decision Making Risk Prescription drug management.        Final diagnoses:  Oral ulceration  Pharyngitis, unspecified etiology    ED Discharge Orders          Ordered    HYDROcodone -acetaminophen  (NORCO/VICODIN) 5-325 MG tablet  Every 4 hours PRN        04/12/24 2341    magic mouthwash (nystatin , lidocaine , diphenhydrAMINE, alum & mag hydroxide) suspension  4 times daily PRN        04/12/24 2341               Odell Balls, PA-C 04/12/24 2343    Mannie Pac T, DO 04/14/24 [phone]

## 2024-04-12 NOTE — Telephone Encounter (Signed)
 FYI Only or Action Required?: FYI only for provider.  Patient was last seen in primary care on 04/07/2024 by Panosh, Apolinar POUR, MD.  Called Nurse Triage reporting No chief complaint on file..  Symptoms began several days ago.  Interventions attempted: Prescription medications: Amoxicillin .  Symptoms are: gradually worsening.  Triage Disposition: See Physician Within 24 Hours  Patient/caregiver understands and will follow disposition?: Yes    Copied from CRM [id number]. Topic: Clinical - Red Word Triage >> Apr 12, 2024  2:28 PM Taleah C wrote: Red Word that prompted transfer to Nurse Triage: seen for sore throat and ear ache last week, prescribed amoxicillin , sxs worse now, feels like ears are bleeding/mouth on fire Reason for Disposition  [1] Taking antibiotic > 24 hours for strep throat AND [2] sore throat pain is SEVERE  Answer Assessment - Initial Assessment Questions 1. SYMPTOM: What's the main symptom you're concerned about? (e.g., fever, difficulty swallowing, sore throat)     Headache (Severe), Earache, Sore Throat  2. ANTIBIOTIC: What antibiotic are you taking? How many times a day?     Amoxicillin   3. ONSET: When was the antibiotic started?     This past Thursday  4. THROAT PAIN:  How bad is the sore throat? (Scale 1-10; mild, moderate or severe)   - MILD (1-3):  Doesn't interfere with eating or normal activities.   - MODERATE (4-7): Interferes with eating some solids and normal activities.   - SEVERE (8-10):  Excruciating pain, interferes with most normal activities.   - SEVERE WITH DYSPHAGIA (10): Can't swallow liquids, drooling.     Severe  5. FEVER: Do you have a fever? If Yes, ask: What is your temperature, how was it measured, and when did it start?      No  6. OTHER SYMPTOMS: Do you have any other symptoms? (e.g., rash)     Chills  7. BETTER-SAME-WORSE: Are you getting better, staying the same, or getting worse compared to the day you started  the antibiotics?     Worse  8. PREGNANCY: Is there any chance you are pregnant? When was your last menstrual period?     No and No  Protocols used: Strep Throat Infection on Antibiotic Follow-up Call-[facility]

## 2024-04-12 NOTE — Telephone Encounter (Signed)
 FYI Only or Action Required?: FYI only for provider.  Patient was last seen in primary care on 04/07/2024 by Panosh, Apolinar POUR, MD.  Called Nurse Triage reporting Headache.  Symptoms began x 2 days.  Interventions attempted: Nothing.  Symptoms are: gradually worsening.  Triage Disposition: No disposition on file.  Patient/caregiver understands and will follow disposition?:       Copied from CRM [id number]. Topic: Clinical - Red Word Triage >> Apr 12, 2024  2:28 PM Taleah C wrote: Red Word that prompted transfer to Nurse Triage: seen for sore throat and ear ache last week, prescribed amoxicillin , sxs worse now, feels like ears are bleeding/mouth on fire >> Apr 12, 2024  4:58 PM Abigail D wrote: Patient advised to call back if her symptoms worsen which they have. Reason for Disposition  [1] SEVERE headache (e.g., excruciating) AND [2] worst headache of life  Answer Assessment - Initial Assessment Questions 1. LOCATION: Where does it hurt?      Ears, temples, & other areas of head 2. ONSET: When did the headache start? (Minutes, hours or days)      X 2 days 3. PATTERN: Does the pain come and go, or has it been constant since it started?     constant 4. SEVERITY: How bad is the pain? and What does it keep you from doing?  (e.g., Scale 1-10; mild, moderate, or severe)   - MILD (1-3): doesn't interfere with normal activities    - MODERATE (4-7): interferes with normal activities or awakens from sleep    - SEVERE (8-10): excruciating pain, unable to do any normal activities        10/10 5. RECURRENT SYMPTOM: Have you ever had headaches before? If Yes, ask: When was the last time? and What happened that time?      no 6. CAUSE: What do you think is causing the headache?     Possibly sinus 7. MIGRAINE: Have you been diagnosed with migraine headaches? If Yes, ask: Is this headache similar?      no 8. HEAD INJURY: Has there been any recent injury to the head?       no 9. OTHER SYMPTOMS: Do you have any other symptoms? (fever, stiff neck, eye pain, sore throat, cold symptoms)     Lightheaded, mouth pain 10. PREGNANCY: Is there any chance you are pregnant? When was your last menstrual period?       No  Pt stated sometimes her head hurts so bad - she just lays down with hopes it eases off but at same time pt is scared she will not wake up.  Patient starts crying stated unable to take pain, needs help.  Nurse recommend pt go to ER now: pt verbalized understanding.  Pt also asked about a work note to prevent her from getting fired since nurse recommended ED.  Nurse instructed patient to call office after visit to inquire about note related to nurse recommends after ED visit or attempt to get one from ED will she goes..    Pt also stated pain starts in the mouth/gum area: pain starts in roof/gum of mouth then spreads to head.  Protocols used: [facility]

## 2024-04-12 NOTE — ED Triage Notes (Addendum)
 Pt POV c/o sore throat, BL ear pain, head pressure since 04/01/24, worsening x2 days. Prescribed antibiotics on Wednesday, taking as prescribed.   Hx of tonsillitis. Decreased po intake due to pain with swallowing.

## 2024-04-12 NOTE — Telephone Encounter (Signed)
 Patient has an appt 7/8

## 2024-04-13 ENCOUNTER — Ambulatory Visit: Admitting: Family Medicine

## 2024-04-13 ENCOUNTER — Telehealth (HOSPITAL_BASED_OUTPATIENT_CLINIC_OR_DEPARTMENT_OTHER): Payer: Self-pay | Admitting: Emergency Medicine

## 2024-04-13 ENCOUNTER — Telehealth: Payer: Self-pay

## 2024-04-13 MED ORDER — LIDOCAINE VISCOUS HCL 2 % MT SOLN: 5.0000 mL | Freq: Four times a day (QID) | OROMUCOSAL | 0 refills | Status: DC

## 2024-04-13 NOTE — Telephone Encounter (Signed)
 Copied from CRM [id number]. Topic: General - Other >> Apr 13, 2024  9:24 AM Berneda FALCON wrote: Reason for CRM: Pt states that she needs documentation that the NT she spoke to instructed her to leave work and go to the ED yesterday 04/12/24 for work and that she is needing to be off of work until 7/12 for a headache. She did go to the ED, and I let her know it may be best to get this note from the ED since they are the one who treated her for her symptoms, but she requests this come from PCP since it was the nurse who told her to leave work  She states we can put this note in her MyChart. Pt callback is [phone] if needed

## 2024-04-13 NOTE — ED Notes (Signed)
 Patient called in and stated Magic Mouthwash was not called into pharmacy.  Printed AVS and gave to ED physician to review and take appropriate actions.

## 2024-05-04 NOTE — Telephone Encounter (Signed)
 I was out of town last week  did she get a note?  If not get her a note as she requested .

## 2024-05-05 NOTE — Telephone Encounter (Signed)
 Looks like ED provided pt an excuse note for 04/12/2024.  Attempted to reach pt. Left a detail message to call us  back if still need an excuse letter.

## 2024-09-06 ENCOUNTER — Ambulatory Visit: Payer: Self-pay

## 2024-09-06 NOTE — Telephone Encounter (Signed)
 FYI Only or Action Required?: FYI only for provider: appointment scheduled on 09/07/24.  Patient was last seen in primary care on 04/07/2024 by Panosh, Apolinar POUR, MD.  Called Nurse Triage reporting Foot Swelling and Foot Pain.  Symptoms began a week ago.  Interventions attempted: OTC medications: ibuprofen .  Symptoms are: right ball of foot pain with swelling to right great and second toes gradually worsening.  Triage Disposition: See Physician Within 24 Hours  Patient/caregiver understands and will follow disposition?: Yes           Copied from CRM #[id number]. Topic: Clinical - Red Word Triage >> Sep 06, 2024  8:46 AM Montie POUR wrote: Red Word that prompted transfer to Nurse Triage:  She is having pain in her right toes. Pain level is a 10. This has been going on since last Tuesday. Reason for Disposition  [1] Swollen foot AND [2] no fever  (Exceptions: Localized bump from bunions, calluses, insect bite, sting.)  Answer Assessment - Initial Assessment Questions 1. ONSET: When did the pain start?      Tuesday.  2. LOCATION: Where is the pain located?      Right great toe and ball of foot.  3. PAIN: How bad is the pain?    (Scale 1-10; or mild, moderate, severe)     8.5/10. Feels like a cramp or muscle pain.  4. WORK OR EXERCISE: Has there been any recent work or exercise that involved this part of the body?      Worse when walking or driving for a while. She states she has to walk around at work but she wears steel toed boots. She states this has happened before.  5. CAUSE: What do you think is causing the foot pain?     She states she thought it was plantar fascitis.  6. OTHER SYMPTOMS: Do you have any other symptoms? (e.g., leg pain, rash, fever, numbness)     Bruising and swelling to right great toe and second toe since yesterday. Denies chest pain, SOB, fever, redness, rash.  7. PREGNANCY: Is there any chance you are pregnant? When was your last menstrual  period?     She states she doesn't have a period due to birth control. She receives Depo shots.  Protocols used: Foot Pain-[facility]

## 2024-09-07 ENCOUNTER — Encounter: Payer: Self-pay | Admitting: Family Medicine

## 2024-09-07 ENCOUNTER — Ambulatory Visit: Admitting: Family Medicine

## 2024-09-07 VITALS — BP 98/70 | HR 85 | Temp 99.1°F | Ht 63.0 in | Wt 146.4 lb

## 2024-09-07 DIAGNOSIS — M79671 Pain in right foot: Secondary | ICD-10-CM

## 2024-09-07 MED ORDER — METHYLPREDNISOLONE 4 MG PO TBPK: ORAL_TABLET | ORAL | 0 refills | Status: AC

## 2024-09-07 NOTE — Telephone Encounter (Signed)
 Noted- ok to close.

## 2024-09-07 NOTE — Progress Notes (Unsigned)
   Acute Office Visit  Subjective:     Patient ID: Janice Sutton, female    DOB: Dec 24, 2000, 23 y.o.   MRN: [id number]  Chief Complaint  Patient presents with   Pain    Patient complains of pain noted along the ball of the right foot x1 week, no known injury, tried Ibuprofen  with no relief   Contraception    Patient requests to have Depo Provera  injection given today during visit-now overdue as last given on April 07, 2024 and per dosing schedule should have been given from 9/17-10/1    HPI Patient is in today for  ROS      Objective:    BP 98/70   Pulse 85   Temp 99.1 F (37.3 C) (Oral)   Ht 5' 3 (1.6 m)   Wt 146 lb 6.4 oz (66.4 kg)   SpO2 98%   BMI 25.93 kg/m  {Vitals History (Optional):23777}  Physical Exam Cardiovascular:     Heart sounds: Normal heart sounds.  Pulmonary:     Effort: Pulmonary effort is normal.     No results found for any visits on 09/07/24.      Assessment & Plan:   Problem List Items Addressed This Visit   None Visit Diagnoses       Right foot pain    -  Primary   Relevant Medications   methylPREDNISolone  (MEDROL  DOSEPAK) 4 MG TBPK tablet   Other Relevant Orders   Sedimentation Rate   C-reactive Protein   Uric acid   Rheumatoid factor   DG Foot Complete Right   CBC with Differential/Platelet       Meds ordered this encounter  Medications   methylPREDNISolone  (MEDROL  DOSEPAK) 4 MG TBPK tablet    Sig: Take package as directed.    Dispense:  21 each    Refill:  0    No follow-ups on file.  Heron CHRISTELLA Sharper, MD

## 2024-09-08 ENCOUNTER — Other Ambulatory Visit

## 2024-09-08 ENCOUNTER — Ambulatory Visit

## 2024-09-08 DIAGNOSIS — M79671 Pain in right foot: Secondary | ICD-10-CM

## 2024-09-09 ENCOUNTER — Ambulatory Visit: Payer: Self-pay | Admitting: Family Medicine

## 2024-09-09 DIAGNOSIS — L03119 Cellulitis of unspecified part of limb: Secondary | ICD-10-CM

## 2024-09-09 LAB — CBC WITH DIFFERENTIAL/PLATELET
Basophils Relative: 0 % (ref 0.0–3.0)
Eosinophils Relative: 1 % (ref 0.0–5.0)
HCT: 41.2 % (ref 36.0–46.0)
Hemoglobin: 13.8 g/dL (ref 12.0–15.0)
Lymphocytes Relative: 37 % (ref 12.0–46.0)
MCHC: 33.5 g/dL (ref 30.0–36.0)
MCV: 97.1 fl (ref 78.0–100.0)
Monocytes Relative: 6 % (ref 3.0–12.0)
Neutrophils Relative %: 56 % (ref 43.0–77.0)
Platelets: 318 K/uL (ref 150.0–400.0)
RBC: 4.25 Mil/uL (ref 3.87–5.11)
RDW: 13.4 % (ref 11.5–15.5)
WBC: 12 K/uL — ABNORMAL HIGH (ref 4.0–10.5)

## 2024-09-09 LAB — C-REACTIVE PROTEIN: CRP: <0.5 mg/dL (ref 0.5–20.0)

## 2024-09-09 LAB — URIC ACID: Uric Acid, Serum: 4.8 mg/dL (ref 2.4–7.0)

## 2024-09-09 LAB — RHEUMATOID FACTOR: Rheumatoid fact SerPl-aCnc: <10 [IU]/mL (ref <14)

## 2024-09-09 LAB — SEDIMENTATION RATE: Sed Rate: 14 mm/h (ref 0–20)

## 2024-09-10 MED ORDER — DOXYCYCLINE HYCLATE 100 MG PO TABS: 100.0000 mg | ORAL_TABLET | Freq: Two times a day (BID) | ORAL | 0 refills | Status: AC
